# Patient Record
Sex: Male | Born: 1950 | Race: White | Hispanic: No | Marital: Married | Smoking: Former smoker
Health system: Southern US, Community
[De-identification: ages and names within clinical notes are randomized; demographics above are authoritative.]

## PROBLEM LIST (undated history)

## (undated) DIAGNOSIS — E785 Hyperlipidemia, unspecified: Secondary | ICD-10-CM

## (undated) DIAGNOSIS — B019 Varicella without complication: Secondary | ICD-10-CM

## (undated) DIAGNOSIS — N200 Calculus of kidney: Secondary | ICD-10-CM

## (undated) DIAGNOSIS — I10 Essential (primary) hypertension: Secondary | ICD-10-CM

## (undated) DIAGNOSIS — T7840XA Allergy, unspecified, initial encounter: Secondary | ICD-10-CM

## (undated) DIAGNOSIS — C8590 Non-Hodgkin lymphoma, unspecified, unspecified site: Secondary | ICD-10-CM

## (undated) HISTORY — DX: Varicella without complication: B01.9

## (undated) HISTORY — DX: Allergy, unspecified, initial encounter: T78.40XA

## (undated) HISTORY — PX: LYMPH NODE BIOPSY: SHX201

## (undated) HISTORY — DX: Essential (primary) hypertension: I10

## (undated) HISTORY — DX: Hyperlipidemia, unspecified: E78.5

## (undated) HISTORY — DX: Non-Hodgkin lymphoma, unspecified, unspecified site: C85.90

## (undated) HISTORY — DX: Calculus of kidney: N20.0

---

## 1983-09-03 HISTORY — PX: LITHOTRIPSY: SUR834

## 2015-08-09 ENCOUNTER — Encounter: Payer: Self-pay | Admitting: Podiatry

## 2015-08-09 ENCOUNTER — Ambulatory Visit (INDEPENDENT_AMBULATORY_CARE_PROVIDER_SITE_OTHER): Payer: Medicare Other | Admitting: Podiatry

## 2015-08-09 VITALS — BP 142/95 | HR 112 | Resp 16

## 2015-08-09 DIAGNOSIS — L923 Foreign body granuloma of the skin and subcutaneous tissue: Secondary | ICD-10-CM

## 2015-08-09 MED ORDER — DOXYCYCLINE HYCLATE 100 MG PO TABS: 100.0000 mg | ORAL_TABLET | Freq: Two times a day (BID) | ORAL | Status: DC

## 2015-08-09 NOTE — Progress Notes (Signed)
   Subjective:    Patient ID: Cameron Miles, male    DOB: 10/18/1950, 64 y.o.   MRN: [id number]  HPI: He presents today for a chief complaint of a splinter forefoot left times several months. He states he was back in the summer he stepped on his front porch and receive a splinter in the forefoot. He states that he his wife have worked on and on occasions to try to remove it however has become more painful. He states that as of late he is starting to feel bad and his foot is starting to hurt worse. He states that currently it is so painful he can no longer stand on it.    Review of Systems  HENT: Positive for sinus pressure.   Musculoskeletal: Positive for back pain.  All other systems reviewed and are negative.      Objective:   Physical Exam: 64 year old male vital signs stable alert and oriented 3 in no apparent distress. Pulses are strongly palpable neurologic sensorium is intact percent joint C monofilament. Deep tendon reflexes are intact muscle strength is normal bilateral. Orthopedic evaluation demonstrates all joints distal to the ankle for range of motion or crepitation. Cutaneous evaluation demonstrates a dime size area of erythema plantar aspect of the forefoot right beneath the third metatarsal phalangeal joint. This area of erythema is a central area of scar and reactive hyperkeratosis. I debrided this and purulence was expressed as well as a quarter inch long splinter of bone. This appears to be a superficial foreign body. Very superficial cellulitic abscess lesion.        Assessment & Plan:  Assessment retention foreign body right foot.  Plan: I and D with removal of foreign body. I discussed soaking twice daily Epsom salts and water and I also wrote a prescription for doxycycline. I will follow-up with him in 2 weeks.

## 2015-11-16 DIAGNOSIS — J301 Allergic rhinitis due to pollen: Secondary | ICD-10-CM | POA: Diagnosis not present

## 2015-11-16 DIAGNOSIS — Z79899 Other long term (current) drug therapy: Secondary | ICD-10-CM | POA: Diagnosis not present

## 2015-11-16 DIAGNOSIS — I10 Essential (primary) hypertension: Secondary | ICD-10-CM | POA: Diagnosis not present

## 2015-11-16 DIAGNOSIS — Z125 Encounter for screening for malignant neoplasm of prostate: Secondary | ICD-10-CM | POA: Diagnosis not present

## 2015-11-16 DIAGNOSIS — E7800 Pure hypercholesterolemia, unspecified: Secondary | ICD-10-CM | POA: Diagnosis not present

## 2015-12-05 DIAGNOSIS — R739 Hyperglycemia, unspecified: Secondary | ICD-10-CM | POA: Diagnosis not present

## 2015-12-07 DIAGNOSIS — I10 Essential (primary) hypertension: Secondary | ICD-10-CM | POA: Diagnosis not present

## 2015-12-07 DIAGNOSIS — R748 Abnormal levels of other serum enzymes: Secondary | ICD-10-CM | POA: Diagnosis not present

## 2015-12-07 DIAGNOSIS — E8881 Metabolic syndrome: Secondary | ICD-10-CM | POA: Diagnosis not present

## 2015-12-07 DIAGNOSIS — R7301 Impaired fasting glucose: Secondary | ICD-10-CM | POA: Diagnosis not present

## 2016-02-13 ENCOUNTER — Ambulatory Visit: Payer: Self-pay | Admitting: Family Medicine

## 2016-05-20 DIAGNOSIS — E7800 Pure hypercholesterolemia, unspecified: Secondary | ICD-10-CM | POA: Diagnosis not present

## 2016-05-20 DIAGNOSIS — Z0000 Encounter for general adult medical examination without abnormal findings: Secondary | ICD-10-CM | POA: Diagnosis not present

## 2016-05-20 DIAGNOSIS — Z79899 Other long term (current) drug therapy: Secondary | ICD-10-CM | POA: Diagnosis not present

## 2016-05-20 DIAGNOSIS — Z23 Encounter for immunization: Secondary | ICD-10-CM | POA: Diagnosis not present

## 2016-05-20 DIAGNOSIS — R739 Hyperglycemia, unspecified: Secondary | ICD-10-CM | POA: Diagnosis not present

## 2016-05-20 DIAGNOSIS — I10 Essential (primary) hypertension: Secondary | ICD-10-CM | POA: Diagnosis not present

## 2016-05-20 DIAGNOSIS — Z125 Encounter for screening for malignant neoplasm of prostate: Secondary | ICD-10-CM | POA: Diagnosis not present

## 2016-05-30 DIAGNOSIS — Z1211 Encounter for screening for malignant neoplasm of colon: Secondary | ICD-10-CM | POA: Diagnosis not present

## 2016-07-31 DIAGNOSIS — J0100 Acute maxillary sinusitis, unspecified: Secondary | ICD-10-CM | POA: Diagnosis not present

## 2016-11-14 DIAGNOSIS — Z79899 Other long term (current) drug therapy: Secondary | ICD-10-CM | POA: Diagnosis not present

## 2016-11-14 DIAGNOSIS — R739 Hyperglycemia, unspecified: Secondary | ICD-10-CM | POA: Diagnosis not present

## 2016-11-14 DIAGNOSIS — I10 Essential (primary) hypertension: Secondary | ICD-10-CM | POA: Diagnosis not present

## 2016-11-18 DIAGNOSIS — M25561 Pain in right knee: Secondary | ICD-10-CM | POA: Diagnosis not present

## 2016-11-18 DIAGNOSIS — I10 Essential (primary) hypertension: Secondary | ICD-10-CM | POA: Diagnosis not present

## 2016-11-18 DIAGNOSIS — E7800 Pure hypercholesterolemia, unspecified: Secondary | ICD-10-CM | POA: Diagnosis not present

## 2016-11-18 DIAGNOSIS — R739 Hyperglycemia, unspecified: Secondary | ICD-10-CM | POA: Diagnosis not present

## 2016-11-18 DIAGNOSIS — Z125 Encounter for screening for malignant neoplasm of prostate: Secondary | ICD-10-CM | POA: Diagnosis not present

## 2016-11-18 DIAGNOSIS — G8929 Other chronic pain: Secondary | ICD-10-CM | POA: Diagnosis not present

## 2016-11-18 DIAGNOSIS — Z79899 Other long term (current) drug therapy: Secondary | ICD-10-CM | POA: Diagnosis not present

## 2017-01-01 DIAGNOSIS — D492 Neoplasm of unspecified behavior of bone, soft tissue, and skin: Secondary | ICD-10-CM | POA: Diagnosis not present

## 2017-01-01 DIAGNOSIS — D229 Melanocytic nevi, unspecified: Secondary | ICD-10-CM | POA: Diagnosis not present

## 2017-01-30 DIAGNOSIS — D234 Other benign neoplasm of skin of scalp and neck: Secondary | ICD-10-CM | POA: Diagnosis not present

## 2017-02-10 DIAGNOSIS — Z4802 Encounter for removal of sutures: Secondary | ICD-10-CM | POA: Diagnosis not present

## 2017-03-27 ENCOUNTER — Encounter: Payer: Self-pay | Admitting: Family Medicine

## 2017-03-27 ENCOUNTER — Ambulatory Visit (INDEPENDENT_AMBULATORY_CARE_PROVIDER_SITE_OTHER): Payer: Medicare Other | Admitting: Family Medicine

## 2017-03-27 VITALS — BP 152/80 | HR 89 | Temp 98.1°F | Resp 18 | Ht 72.5 in | Wt 177.1 lb

## 2017-03-27 DIAGNOSIS — H539 Unspecified visual disturbance: Secondary | ICD-10-CM | POA: Diagnosis not present

## 2017-03-27 DIAGNOSIS — I10 Essential (primary) hypertension: Secondary | ICD-10-CM | POA: Diagnosis not present

## 2017-03-27 DIAGNOSIS — M25422 Effusion, left elbow: Secondary | ICD-10-CM | POA: Diagnosis not present

## 2017-03-27 DIAGNOSIS — R6882 Decreased libido: Secondary | ICD-10-CM | POA: Diagnosis not present

## 2017-03-27 DIAGNOSIS — N471 Phimosis: Secondary | ICD-10-CM

## 2017-03-27 LAB — TESTOSTERONE: Testosterone: 458.04 ng/dL (ref 300.00–890.00)

## 2017-03-27 NOTE — Addendum Note (Signed)
Addended by: Leone Haven on: 03/27/2017 04:56 PM   Modules accepted: Orders

## 2017-03-27 NOTE — Assessment & Plan Note (Addendum)
Suspect related to trauma of hitting his elbow. There are no signs of infection. Discussed referral to sports medicine to have this drained if needed though he declined this at this time and opted to continue management at home. Could trial anti-inflammatories over-the-counter. Continue ice and heat. If not improving over the next 1-2 weeks he will let us know so we can refer him.

## 2017-03-27 NOTE — Assessment & Plan Note (Signed)
Some intermittent trouble focusing at times. I suggested he see an eye doctor.

## 2017-03-27 NOTE — Assessment & Plan Note (Signed)
I suspect this is related to his phimosis and discomfort he gets with sexual intercourse related to this. We will check a testosterone level. We'll refer to urology.

## 2017-03-27 NOTE — Assessment & Plan Note (Signed)
Elevated today though at goal at home. He'll continue to monitor at home and let us know what it is running in 2-3 weeks. He'll continue clonidine.

## 2017-03-27 NOTE — Progress Notes (Signed)
Tommi Rumps, MD Phone: [phone]  Cameron Miles is a 66 y.o. male who presents today for new patient.  HYPERTENSION  Disease Monitoring  Home BP Monitoring 120s/70-80 Chest pain- no    Dyspnea- no Medications  Compliance-  Taking clonidine. Lightheadedness-  no  Edema- no  Patient notes low libido for the last several years. He does have difficulty getting an erection and maintaining an erection. Does get some nighttime erections are not as frequent as prior. His penis is not correct his previously either. He is able to ejaculate. He does note some phimosis that has been going on for some time now. This will tear and become painful when having sexual intercourse.  3 weeks ago patient reports he hit his right elbow on the refrigerator. He notes the elbow became very swollen and there was bruising just proximal to the elbow just distal to the elbow. He notes the swelling and bruising has gone down quite a bit. He notes the bursa over the olecranon process has remained somewhat swollen though is 2-3 times better than it was prior. Minimal tenderness at times. Has been icing and using a warm bath.  Reports occasional trouble focusing with his vision. Looks at a screen for a long time. Does wear reading glasses. No specific distance vision issues.  Active Ambulatory Problems    Diagnosis Date Noted  . Hypertension 03/27/2017  . Decreased libido 03/27/2017  . Effusion of left olecranon bursa 03/27/2017  . Vision changes 03/27/2017   Resolved Ambulatory Problems    Diagnosis Date Noted  . No Resolved Ambulatory Problems   Past Medical History:  Diagnosis Date  . Allergy   . Chicken pox   . Hyperlipidemia   . Hypertension   . Kidney stones     Family History  Problem Relation Age of Onset  . Stroke Mother   . Prostate cancer Brother   . Hyperlipidemia Brother   . Heart disease Brother   . Stroke Brother   . Kidney disease Brother   . Mental illness Brother   . Melanoma  Brother     Social History   Social History  . Marital status: Married    Spouse name: N/A  . Number of children: N/A  . Years of education: N/A   Occupational History  . Not on file.   Social History Main Topics  . Smoking status: Former Smoker    Quit date: 08/08/1969  . Smokeless tobacco: Never Used  . Alcohol use 0.0 oz/week  . Drug use: No  . Sexual activity: Yes   Other Topics Concern  . Not on file   Social History Narrative  . No narrative on file    ROS  General:  Negative for nexplained weight loss, fever Skin: Negative for new or changing mole, sore that won't heal HEENT: Positive for trouble seeing, ringing in ears, Negative for trouble hearing, mouth sores, hoarseness, change in voice, dysphagia. CV:  Negative for chest pain, dyspnea, edema, palpitations Resp: Negative for cough, dyspnea, hemoptysis GI: Negative for nausea, vomiting, diarrhea, constipation, abdominal pain, melena, hematochezia. GU: Positive for sexual difficulty, Negative for dysuria, incontinence, urinary hesitance, hematuria, vaginal or penile discharge, polyuria, lumps in testicle or breasts MSK: Negative for muscle cramps or aches, joint pain or swelling Neuro: Negative for headaches, weakness, numbness, dizziness, passing out/fainting Psych: Negative for depression, anxiety, memory problems  Objective  Physical Exam Vitals:   03/27/17 0916  BP: (!) 152/80  Pulse: 89  Resp: 18  Temp: 98.1 F (36.7 C)    BP Readings from Last 3 Encounters:  03/27/17 (!) 152/80  08/09/15 (!) 142/95   Wt Readings from Last 3 Encounters:  03/27/17 177 lb 2 oz (80.3 kg)    Physical Exam  Constitutional: No distress.  HENT:  Head: Normocephalic and atraumatic.  Mouth/Throat: Oropharynx is clear and moist. No oropharyngeal exudate.  Eyes: Pupils are equal, round, and reactive to light. Conjunctivae are normal.  Cardiovascular: Normal rate, regular rhythm and normal heart sounds.     Pulmonary/Chest: Effort normal and breath sounds normal.  Abdominal: Soft. Bowel sounds are normal. He exhibits no distension. There is no tenderness. There is no rebound and no guarding.  Genitourinary:  Genitourinary Comments: Uncircumcised, phimosis noted with difficulty retracting foreskin, no discharge, no tenderness, no discomfort, normal scrotum, normal testicles, normal epididymis, normal vas deferens, no inguinal hernias  Musculoskeletal: He exhibits no edema.  Right olecranon bursa enlarged, there is no tenderness warmth or erythema, full range of motion bilateral elbows, 5/5 strength bilateral biceps, triceps, and grip  Neurological: He is alert. Gait normal.  Skin: Skin is warm and dry. He is not diaphoretic.  Psychiatric: Mood and affect normal.     Assessment/Plan:   Hypertension Elevated today though at goal at home. He'll continue to monitor at home and let us know what it is running in 2-3 weeks. He'll continue clonidine.  Decreased libido I suspect this is related to his phimosis and discomfort he gets with sexual intercourse related to this. We will check a testosterone level. We'll refer to urology.  Effusion of left olecranon bursa Suspect related to trauma of hitting his elbow. There are no signs of infection. Discussed referral to sports medicine to have this drained if needed though he declined this at this time and opted to continue management at home. Could trial anti-inflammatories over-the-counter. Continue ice and heat. If not improving over the next 1-2 weeks he will let us know so we can refer him.  Vision changes Some intermittent trouble focusing at times. I suggested he see an eye doctor.   Orders Placed This Encounter  Procedures  . Testosterone  . Ambulatory referral to Urology    Referral Priority:   Routine    Referral Type:   Consultation    Referral Reason:   Specialty Services Required    Requested Specialty:   Urology    Number of Visits  Requested:   1    No orders of the defined types were placed in this encounter.    Tommi Rumps, MD [facility]

## 2017-03-27 NOTE — Patient Instructions (Signed)
Nice to meet you. Please monitor your right elbow. You could try anti-inflammatories such as ibuprofen or Aleve over-the-counter. She can also use heat and ice. We'll refer you to urology. I would advise seeing an eye doctor as well. We'll call you with your testosterone result.

## 2017-04-18 ENCOUNTER — Encounter: Payer: Self-pay | Admitting: Urology

## 2017-04-18 ENCOUNTER — Ambulatory Visit (INDEPENDENT_AMBULATORY_CARE_PROVIDER_SITE_OTHER): Payer: Medicare Other | Admitting: Urology

## 2017-04-18 VITALS — BP 146/81 | HR 96 | Ht 72.05 in | Wt 177.3 lb

## 2017-04-18 DIAGNOSIS — Z8042 Family history of malignant neoplasm of prostate: Secondary | ICD-10-CM | POA: Insufficient documentation

## 2017-04-18 DIAGNOSIS — N400 Enlarged prostate without lower urinary tract symptoms: Secondary | ICD-10-CM

## 2017-04-18 DIAGNOSIS — Z125 Encounter for screening for malignant neoplasm of prostate: Secondary | ICD-10-CM | POA: Diagnosis not present

## 2017-04-18 DIAGNOSIS — N5201 Erectile dysfunction due to arterial insufficiency: Secondary | ICD-10-CM

## 2017-04-18 DIAGNOSIS — N471 Phimosis: Secondary | ICD-10-CM | POA: Diagnosis not present

## 2017-04-18 NOTE — Addendum Note (Signed)
Addended by: Kyra Manges on: 04/18/2017 11:44 AM   Modules accepted: Orders

## 2017-04-18 NOTE — Progress Notes (Signed)
04/18/2017 7:37 AM   Cameron Miles 1951/01/16 [id number]  Referring provider: Leone Haven, MD [address] [place], [place] [address]  CC: new patient ED, phimosis, prostate screening  HPI:  1. Prostate cancer screening - Pt's brother with advanced prostate cancer 04/2017 DRE 60gm smooth / PSA (today / pending)  2. Phimosis - progresive bother form inability to completely retract foreskin. Mostly bothersome during intercourse. Exam c/w mild phimosis but impressive glans adhesions.   3 - Urolithiasis - s/p SWL in 7494W, no colic since.  PMH sig for HTN. No ischemic CV disease / blood thinners . His PCP is Tommi Rumps with [facility].  Today "Cameron Miles " is seen as new patient for above.    PMH: Past Medical History:  Diagnosis Date  . Allergy   . Chicken pox   . Hyperlipidemia   . Hypertension   . Kidney stones     Surgical History: Past Surgical History:  Procedure Laterality Date  . LITHOTRIPSY  1985    Home Medications:  Allergies as of 04/18/2017   No Known Allergies     Medication List       Accurate as of 04/18/17  7:37 AM. Always use your most recent med list.          cloNIDine 0.1 MG tablet Commonly known as:  CATAPRES 2 mg 2 (two) times daily.       Allergies: No Known Allergies  Family History: Family History  Problem Relation Age of Onset  . Stroke Mother   . Prostate cancer Brother   . Hyperlipidemia Brother   . Heart disease Brother   . Stroke Brother   . Kidney disease Brother   . Mental illness Brother   . Melanoma Brother     Social History:  reports that he quit smoking about 47 years ago. He has never used smokeless tobacco. He reports that he drinks alcohol. He reports that he does not use drugs.   Review of Systems  Gastrointestinal (upper)  : Negative for upper GI symptoms  Gastrointestinal (lower) : Negative for lower GI symptoms  Constitutional : Negative for symptoms  Skin: Negative for skin  symptoms  Eyes: Negative for eye symptoms  Ear/Nose/Throat : Negative for Ear/Nose/Throat symptoms  Hematologic/Lymphatic: Negative for Hematologic/Lymphatic symptoms  Cardiovascular : Negative for cardiovascular symptoms  Respiratory : Negative for respiratory symptoms  Endocrine: Negative for endocrine symptoms  Musculoskeletal: Negative for musculoskeletal symptoms  Neurological: Negative for neurological symptoms  Psychologic: Negative for psychiatric symptoms   Physical Exam: There were no vitals taken for this visit.  Constitutional:  Alert and oriented, No acute distress. HEENT: [place] AT, moist mucus membranes.  Trachea midline, no masses. Cardiovascular: No clubbing, cyanosis, or edema. Respiratory: Normal respiratory effort, no increased work of breathing. GI: Abdomen is soft, nontender, nondistended, no abdominal masses GU: No CVA tenderness. Stragght plhalus. Left 2cm upper spermatocele that is non-painful. Mild phimosis with significant posterior glans adhesions.  Skin: No rashes, bruises or suspicious lesions. Lymph: No cervical or inguinal adenopathy. Neurologic: Grossly intact, no focal deficits, moving all 4 extremities. Psychiatric: Normal mood and affect.  Laboratory Data: No results found for: WBC, HGB, HCT, MCV, PLT  No results found for: CREATININE  No results found for: PSA  Lab Results  Component Value Date   TESTOSTERONE 458.04 03/27/2017    No results found for: HGBA1C  Urinalysis No results found for: COLORURINE, APPEARANCEUR, LABSPEC, [place], GLUCOSEU, [place], [place], KETONESUR, PROTEINUR, UROBILINOGEN, NITRITE, LEUKOCYTESUR  Pertinent Imaging: none  Assessment & Plan:    1. Prostate cancer screening - PSA today to complete. Rec continue annualy until age 12 or so given family history.    2. Phimosis - discussed options of observation, topicals (can loosen some), dorsal slit, circumcision. At this point he wants to think  about it, may want circumcision in early new year. He will call us should he want to proceed with that.   3. Nephrolithiasis - no colic on >51 years. Observe.   Alexis Frock, [facility] [address] [place], [place] [address] [phone]

## 2017-04-19 LAB — PSA: PROSTATE SPECIFIC AG, SERUM: 0.6 ng/mL (ref 0.0–4.0)

## 2017-05-09 ENCOUNTER — Telehealth: Payer: Self-pay | Admitting: Family Medicine

## 2017-05-09 ENCOUNTER — Other Ambulatory Visit: Payer: Self-pay | Admitting: Family Medicine

## 2017-05-09 MED ORDER — CLONIDINE HCL 0.1 MG PO TABS: 0.1000 mg | ORAL_TABLET | Freq: Two times a day (BID) | ORAL | 0 refills | Status: DC

## 2017-05-09 NOTE — Telephone Encounter (Signed)
Sent!

## 2017-05-09 NOTE — Telephone Encounter (Signed)
Please advise 

## 2017-05-09 NOTE — Telephone Encounter (Signed)
Pt called and stated that he is out of town and forgot his . He would like to know if we could call in 2 pills until he gets back home. Please advise, thank you!  Pharmacy - [facility] [address]  Call pt @ [phone]

## 2017-05-09 NOTE — Telephone Encounter (Signed)
Forgot what?

## 2017-05-09 NOTE — Telephone Encounter (Signed)
cloNIDine (CATAPRES) 0.1 MG tablet ° °

## 2017-05-19 ENCOUNTER — Ambulatory Visit: Payer: Medicare Other | Admitting: Family Medicine

## 2017-06-30 ENCOUNTER — Ambulatory Visit (INDEPENDENT_AMBULATORY_CARE_PROVIDER_SITE_OTHER): Payer: Medicare Other | Admitting: Family Medicine

## 2017-06-30 ENCOUNTER — Encounter: Payer: Self-pay | Admitting: Family Medicine

## 2017-06-30 VITALS — BP 132/88 | HR 75 | Temp 97.9°F | Wt 181.8 lb

## 2017-06-30 DIAGNOSIS — H938X1 Other specified disorders of right ear: Secondary | ICD-10-CM

## 2017-06-30 DIAGNOSIS — Z125 Encounter for screening for malignant neoplasm of prostate: Secondary | ICD-10-CM | POA: Diagnosis not present

## 2017-06-30 DIAGNOSIS — I10 Essential (primary) hypertension: Secondary | ICD-10-CM

## 2017-06-30 DIAGNOSIS — N471 Phimosis: Secondary | ICD-10-CM

## 2017-06-30 LAB — COMPREHENSIVE METABOLIC PANEL
ALK PHOS: 80 U/L (ref 39–117)
ALT: 34 U/L (ref 0–53)
AST: 47 U/L — ABNORMAL HIGH (ref 0–37)
Albumin: 3.8 g/dL (ref 3.5–5.2)
BUN: 12 mg/dL (ref 6–23)
CALCIUM: 9.2 mg/dL (ref 8.4–10.5)
CHLORIDE: 103 meq/L (ref 96–112)
CO2: 25 mEq/L (ref 19–32)
Creatinine, Ser: 0.68 mg/dL (ref 0.40–1.50)
GFR: 123.17 mL/min (ref >60.00)
Glucose, Bld: 104 mg/dL — ABNORMAL HIGH (ref 70–99)
POTASSIUM: 4.4 meq/L (ref 3.5–5.1)
Sodium: 136 mEq/L (ref 135–145)
TOTAL PROTEIN: 6.6 g/dL (ref 6.0–8.3)
Total Bilirubin: 1.9 mg/dL — ABNORMAL HIGH (ref 0.2–1.2)

## 2017-06-30 NOTE — Progress Notes (Signed)
  Tommi Rumps, MD Phone: [phone]  Cameron Miles is a 66 y.o. male who presents today for follow-up.  Patient notes he had an upper respiratory infection 2-3 weeks ago. Had cough and congestion with postnasal drip. It was a viral illness and that improved though he continues to have some right ear fullness and stopped up sensation. He does have chronic tinnitus which is unchanged. No drainage from his ear.  Hypertension: BP similar to today. Taking clonidine. No chest pain, shortness breath, or edema.  He questions whether or not he should be on aspirin. He does take this intermittently for aches and pains. In the past he possibly had an allergy to this with possible hives that could've been related to oysters or stress. Noted a pulling sensation in his throat around that time as well. 30 years ago he would have hives if he took aspirin though this has not recurred. Over the last 30 years he has intermittently been taking aspirin with no adverse effects.  He saw urology for his phimosis and also had PSA checked given that his brother possibly a prostate cancer. They're considering circumcision in the new year.  PMH: nonsmoker.   ROS see history of present illness  Objective  Physical Exam Vitals:   06/30/17 0928  BP: 132/88  Pulse: 75  Temp: 97.9 F (36.6 C)  SpO2: 95%    BP Readings from Last 3 Encounters:  06/30/17 132/88  04/18/17 (!) 146/81  03/27/17 (!) 152/80   Wt Readings from Last 3 Encounters:  06/30/17 181 lb 12.8 oz (82.5 kg)  04/18/17 177 lb 4.8 oz (80.4 kg)  03/27/17 177 lb 2 oz (80.3 kg)    Physical Exam  Constitutional: No distress.  HENT:  Head: Normocephalic and atraumatic.  Mouth/Throat: Oropharynx is clear and moist. No oropharyngeal exudate.  Normal TMs  Cardiovascular: Normal rate, regular rhythm and normal heart sounds.   Pulmonary/Chest: Effort normal and breath sounds normal.  Musculoskeletal: He exhibits no edema.  Neurological: He is  alert. Gait normal.  Skin: He is not diaphoretic.     Assessment/Plan: Please see individual problem list.  Phimosis Continue to follow with urology.  Prostate cancer screening PSA normal. Brother possibly had prostate cancer. Needs yearly screening.  Ear fullness, right Suspect eustachian tube dysfunction following recent viral illness. Encouraged to continue loratadine. He'll add Flonase. Does note in the past Flonase has made him slightly loopy though he does still have some left. Offered to try Nasonex though he wanted to try Flonase first. If it makes him loopy he'll contact us.  Hypertension Slightly above goal. He'll monitor for the next week at home with a goal of less than 130/80. He'll contact us and let us know what it's been running. Continue clonidine. He will start on 81 mg aspirin daily as well for primary prevention. Seems unlikely that he has an allergy to aspirin given that he has tolerated it over the last 30 years. If he develops any adverse reaction to this he will discontinue it and let us know or be evaluated.   Orders Placed This Encounter  Procedures  . Comp Met (CMET)   Tommi Rumps, MD [place]

## 2017-06-30 NOTE — Assessment & Plan Note (Addendum)
Slightly above goal. He'll monitor for the next week at home with a goal of less than 130/80. He'll contact us and let us know what it's been running. Continue clonidine. He will start on 81 mg aspirin daily as well for primary prevention. Seems unlikely that he has an allergy to aspirin given that he has tolerated it over the last 30 years. If he develops any adverse reaction to this he will discontinue it and let us know or be evaluated.

## 2017-06-30 NOTE — Assessment & Plan Note (Signed)
Continue to follow with urology

## 2017-06-30 NOTE — Patient Instructions (Signed)
Nice to see you. Please start taking a 81 mg aspirin daily. You can start using the Flonase 1 spray each nostril daily to see if that helps with your ears. Please continue the loratadine. Please monitor blood pressure at home over the next week and contact us with the results. Ideally your blood pressure should be less than 130/80. If it is not we may need to add additional medication.

## 2017-06-30 NOTE — Assessment & Plan Note (Signed)
PSA normal. Brother possibly had prostate cancer. Needs yearly screening.

## 2017-06-30 NOTE — Assessment & Plan Note (Addendum)
Suspect eustachian tube dysfunction following recent viral illness. Encouraged to continue loratadine. He'll add Flonase. Does note in the past Flonase has made him slightly loopy though he does still have some left. Offered to try Nasonex though he wanted to try Flonase first. If it makes him loopy he'll contact us.

## 2017-07-03 ENCOUNTER — Other Ambulatory Visit: Payer: Self-pay | Admitting: Family Medicine

## 2017-07-03 DIAGNOSIS — R945 Abnormal results of liver function studies: Principal | ICD-10-CM

## 2017-07-03 DIAGNOSIS — R7989 Other specified abnormal findings of blood chemistry: Secondary | ICD-10-CM

## 2017-07-11 ENCOUNTER — Telehealth: Payer: Self-pay | Admitting: Family Medicine

## 2017-07-11 MED ORDER — CLONIDINE HCL 0.1 MG PO TABS
0.1000 mg | ORAL_TABLET | Freq: Two times a day (BID) | ORAL | 1 refills | Status: DC
Start: 2017-07-11 — End: 2017-07-11

## 2017-07-11 MED ORDER — CLONIDINE HCL 0.1 MG PO TABS: 0.1000 mg | ORAL_TABLET | Freq: Two times a day (BID) | ORAL | 1 refills | Status: DC

## 2017-07-11 MED ORDER — CLONIDINE HCL 0.1 MG PO TABS: 0.1000 mg | ORAL_TABLET | Freq: Two times a day (BID) | ORAL | 0 refills | Status: DC

## 2017-07-11 NOTE — Addendum Note (Signed)
Addended by: Nanci Pina on: 07/11/2017 03:46 PM   Modules accepted: Orders

## 2017-07-11 NOTE — Telephone Encounter (Signed)
Copied from [place] #[id number]. Topic: Inquiry >> Jul 11, 2017  9:07 AM Pricilla Handler wrote: Reason for CRM: Patient called requesting a refill of medication Clonidine (CATAPRES) 0.1 MG tablet. Patient would like a call back today concerning his refill request.

## 2017-07-11 NOTE — Telephone Encounter (Signed)
Okay to refill for 90-day supply.

## 2017-07-11 NOTE — Telephone Encounter (Signed)
This medication was filled for 2 tablets by [facility] it looks ok to do 90 day supply.

## 2017-07-11 NOTE — Telephone Encounter (Signed)
Refill request for Clonidine / LOV 06/30/17 with Dr. Caryl Bis / Pharmacy verified with patient.

## 2017-08-12 ENCOUNTER — Ambulatory Visit: Payer: Medicare Other | Admitting: Gastroenterology

## 2017-10-02 ENCOUNTER — Other Ambulatory Visit: Payer: Self-pay

## 2017-10-02 ENCOUNTER — Other Ambulatory Visit: Payer: Self-pay | Admitting: Family Medicine

## 2017-10-02 ENCOUNTER — Ambulatory Visit (INDEPENDENT_AMBULATORY_CARE_PROVIDER_SITE_OTHER): Payer: Medicare Other | Admitting: Family Medicine

## 2017-10-02 ENCOUNTER — Encounter: Payer: Self-pay | Admitting: Family Medicine

## 2017-10-02 VITALS — BP 150/100 | HR 88 | Temp 97.4°F | Wt 181.0 lb

## 2017-10-02 DIAGNOSIS — J309 Allergic rhinitis, unspecified: Secondary | ICD-10-CM | POA: Diagnosis not present

## 2017-10-02 DIAGNOSIS — I10 Essential (primary) hypertension: Secondary | ICD-10-CM | POA: Diagnosis not present

## 2017-10-02 DIAGNOSIS — N471 Phimosis: Secondary | ICD-10-CM

## 2017-10-02 DIAGNOSIS — Z8042 Family history of malignant neoplasm of prostate: Secondary | ICD-10-CM | POA: Diagnosis not present

## 2017-10-02 DIAGNOSIS — R945 Abnormal results of liver function studies: Secondary | ICD-10-CM

## 2017-10-02 DIAGNOSIS — R7989 Other specified abnormal findings of blood chemistry: Secondary | ICD-10-CM

## 2017-10-02 DIAGNOSIS — R17 Unspecified jaundice: Secondary | ICD-10-CM

## 2017-10-02 LAB — HEPATIC FUNCTION PANEL
ALT: 43 U/L (ref 0–53)
AST: 57 U/L — ABNORMAL HIGH (ref 0–37)
Albumin: 4.1 g/dL (ref 3.5–5.2)
Alkaline Phosphatase: 88 U/L (ref 39–117)
BILIRUBIN DIRECT: 0.8 mg/dL — AB (ref 0.0–0.3)
BILIRUBIN TOTAL: 2.4 mg/dL — AB (ref 0.2–1.2)
Total Protein: 7.6 g/dL (ref 6.0–8.3)

## 2017-10-02 NOTE — Progress Notes (Signed)
Mailed cologuard form per patient request, he states he will mail it back once he signs it

## 2017-10-02 NOTE — Assessment & Plan Note (Signed)
He will try switching to Allegra.

## 2017-10-02 NOTE — Assessment & Plan Note (Signed)
Reports stable.  He will continue to follow with urology.

## 2017-10-02 NOTE — Assessment & Plan Note (Signed)
Well-controlled at home.  He will continue to monitor there.  He will continue clonidine.  If blood pressure elevates at home he will let us know.

## 2017-10-02 NOTE — Assessment & Plan Note (Signed)
Discussed yearly screening.  Prior PSA acceptable.

## 2017-10-02 NOTE — Assessment & Plan Note (Signed)
Recheck today.  If still elevated we will refer to GI again.

## 2017-10-02 NOTE — Progress Notes (Signed)
  Cameron Rumps, MD Phone: [phone]  Cameron Miles is a 67 y.o. male who presents today for follow-up.  Hypertension: Typically runs 120/80 at home.  Taking clonidine.  No chest pain, shortness of breath, or edema.  Notes allergy symptoms with congestion and postnasal drip.  Some sneezing.  Blowing green mucus out of his nose.  2 slight nosebleeds that stopped quickly related to cold weather.  Taking Claritin.  Cannot tolerate nasal steroids.  He saw urology.  He notes his foreskin is about the same.  Notes his PSA was okay.  Does note his brother has history of prostate cancer.  Elevated LFTs: Appear to have been chronically elevated.  He has a couple of alcoholic beverages daily.  He wanted to hold off on GI referral.  Social History   Tobacco Use  Smoking Status Former Smoker  . Last attempt to quit: 08/08/1969  . Years since quitting: 48.1  Smokeless Tobacco Never Used     ROS see history of present illness  Objective  Physical Exam Vitals:   10/02/17 0954  BP: (!) 150/100  Pulse: 88  Temp: (!) 97.4 F (36.3 C)  SpO2: 95%    BP Readings from Last 3 Encounters:  10/02/17 (!) 150/100  06/30/17 132/88  04/18/17 (!) 146/81   Wt Readings from Last 3 Encounters:  10/02/17 181 lb (82.1 kg)  06/30/17 181 lb 12.8 oz (82.5 kg)  04/18/17 177 lb 4.8 oz (80.4 kg)    Physical Exam  Constitutional: No distress.  HENT:  Head: Normocephalic and atraumatic.  Mouth/Throat: Oropharynx is clear and moist. No oropharyngeal exudate.  Cardiovascular: Normal rate, regular rhythm and normal heart sounds.  Pulmonary/Chest: Effort normal and breath sounds normal.  Musculoskeletal: He exhibits no edema.  Neurological: He is alert. Gait normal.  Skin: Skin is warm and dry. He is not diaphoretic.     Assessment/Plan: Please see individual problem list.  Hypertension Well-controlled at home.  He will continue to monitor there.  He will continue clonidine.  If blood pressure  elevates at home he will let us know.  Phimosis Reports stable.  He will continue to follow with urology.  Family history of prostate cancer Discussed yearly screening.  Prior PSA acceptable.  Allergic rhinitis He will try switching to Allegra.  Elevated LFTs Recheck today.  If still elevated we will refer to GI again.   Orders Placed This Encounter  Procedures  . Hepatic function panel    No orders of the defined types were placed in this encounter.    Cameron Rumps, MD [place]

## 2017-10-02 NOTE — Patient Instructions (Signed)
Nice to see you. Please continue to monitor at home.  If it starts to run higher please let us know. Please try Allegra for your sinus issues.  You can discontinue the Claritin.

## 2017-10-29 ENCOUNTER — Ambulatory Visit: Payer: Medicare Other | Admitting: Gastroenterology

## 2017-11-12 ENCOUNTER — Ambulatory Visit (INDEPENDENT_AMBULATORY_CARE_PROVIDER_SITE_OTHER): Payer: Medicare Other | Admitting: Gastroenterology

## 2017-11-12 ENCOUNTER — Other Ambulatory Visit
Admission: RE | Admit: 2017-11-12 | Discharge: 2017-11-12 | Disposition: A | Discharge status: Home / Self Care | Payer: Medicare Other | Source: Ambulatory Visit | Attending: Gastroenterology | Admitting: Gastroenterology

## 2017-11-12 ENCOUNTER — Encounter: Payer: Self-pay | Admitting: Gastroenterology

## 2017-11-12 VITALS — BP 158/100 | HR 98 | Ht 72.0 in | Wt 178.6 lb

## 2017-11-12 DIAGNOSIS — R945 Abnormal results of liver function studies: Secondary | ICD-10-CM

## 2017-11-12 DIAGNOSIS — R7989 Other specified abnormal findings of blood chemistry: Secondary | ICD-10-CM

## 2017-11-12 LAB — CBC WITH DIFFERENTIAL/PLATELET
Basophils Absolute: 0 10*3/uL (ref 0–0.1)
Basophils Relative: 1 %
Eosinophils Absolute: 0.2 10*3/uL (ref 0–0.7)
Eosinophils Relative: 3 %
HEMATOCRIT: 51.7 % (ref 40.0–52.0)
HEMOGLOBIN: 16.8 g/dL (ref 13.0–18.0)
LYMPHS ABS: 1.3 10*3/uL (ref 1.0–3.6)
Lymphocytes Relative: 23 %
MCH: 30 pg (ref 26.0–34.0)
MCHC: 32.6 g/dL (ref 32.0–36.0)
MCV: 92.2 fL (ref 80.0–100.0)
Monocytes Absolute: 0.7 10*3/uL (ref 0.2–1.0)
Monocytes Relative: 13 %
NEUTROS ABS: 3.5 10*3/uL (ref 1.4–6.5)
NEUTROS PCT: 60 %
Platelets: 122 10*3/uL — ABNORMAL LOW (ref 150–440)
RBC: 5.6 MIL/uL (ref 4.40–5.90)
RDW: 13.4 % (ref 11.5–14.5)
WBC: 5.7 10*3/uL (ref 3.8–10.6)

## 2017-11-12 LAB — IRON AND TIBC
Iron: 215 ug/dL — ABNORMAL HIGH (ref 45–182)
Saturation Ratios: 68 % — ABNORMAL HIGH (ref 17.9–39.5)
TIBC: 317 ug/dL (ref 250–450)
UIBC: 102 ug/dL

## 2017-11-12 LAB — FERRITIN: Ferritin: 589 ng/mL — ABNORMAL HIGH (ref 24–336)

## 2017-11-12 LAB — PROTIME-INR
INR: 1.16
Prothrombin Time: 14.7 seconds (ref 11.4–15.2)

## 2017-11-12 NOTE — Patient Instructions (Signed)
1.  Contact Central Scheduling to arrange a date and time for your Ultrasound.  [phone].  2. Complete labs at either [facility] or [facility].  3. Follow-up appointment due in 6 - 8 weeks.

## 2017-11-12 NOTE — Progress Notes (Signed)
Jonathon Bellows MD, MRCP(U.K) [address]  [place]  [place], [place] [address]  Main: [phone]  Fax: [phone]   Gastroenterology Consultation  Referring Provider:     Leone Haven, MD Primary Care Physician:  Leone Haven, MD Primary Gastroenterologist:  Dr. Jonathon Bellows  Reason for Consultation:     Abnormal LFT's         HPI:   Cameron Miles is a 67 y.o. y/o male referred for consultation & management  by Dr. Caryl Bis, Angela Adam, MD.     First noted abnormality in LFT's in 11/2015  .  Alcohol use : Daily , last 20 years, consumes atleast 3 drinks a day , no drink in the morning . Usually a beer.   Drug use : never  Over the counter herbal supplements none, no Internet meds, none recently  New medications : none  Abdominal pain none  Tattoos none  Military service none  Prior blood transfusion none Incarceration none  History of travel 2-3 years back to [place], [place] 20-30 years. Presently retired. Ex case worker  Family history of liver disease none  Recent change in weight : none     Hepatic Function Latest Ref Rng & Units 10/02/2017 06/30/2017  Total Protein 6.0 - 8.3 g/dL 7.6 6.6  Albumin 3.5 - 5.2 g/dL 4.1 3.8  AST 0 - 37 U/L 57(H) 47(H)  ALT 0 - 53 U/L 43 34  Alk Phosphatase 39 - 117 U/L 88 80  Total Bilirubin 0.2 - 1.2 mg/dL 2.4(H) 1.9(H)  Bilirubin, Direct 0.0 - 0.3 mg/dL 0.8(H) -       Past Medical History:  Diagnosis Date  . Allergy   . Chicken pox   . Hyperlipidemia   . Hypertension   . Kidney stones     Past Surgical History:  Procedure Laterality Date  . LITHOTRIPSY  1985    Prior to Admission medications   Medication Sig Start Date End Date Taking? Authorizing Provider  aspirin EC 81 MG tablet Take 81 mg by mouth daily.   Yes [provider]  cloNIDine (CATAPRES) 0.1 MG tablet Take 1 tablet (0.1 mg total) 2 (two) times daily by mouth. 07/11/17  Yes Leone Haven, MD  diphenhydrAMINE (SOMINEX) 25 MG  tablet Take 25 mg by mouth at bedtime as needed for sleep.   Yes [provider]  Loratadine (CLARITIN) 10 MG CAPS Take by mouth.   Yes [provider]  guaiFENesin (MUCINEX) 600 MG 12 hr tablet Take by mouth 2 (two) times daily as needed.     [provider]  ibuprofen (ADVIL,MOTRIN) 200 MG tablet Take 200 mg by mouth every 6 (six) hours as needed.    [provider]    Family History  Problem Relation Age of Onset  . Stroke Mother   . Prostate cancer Brother   . Hyperlipidemia Brother   . Heart disease Brother   . Stroke Brother   . Kidney disease Brother   . Mental illness Brother   . Melanoma Brother   . Bladder Cancer Neg Hx   . Kidney cancer Neg Hx      Social History   Tobacco Use  . Smoking status: Former Smoker    Last attempt to quit: 08/08/1969    Years since quitting: 48.2  . Smokeless tobacco: Never Used  Substance Use Topics  . Alcohol use: Yes    Alcohol/week: 4.2 oz    Types: 7 Standard drinks  or equivalent per week  . Drug use: No    Allergies as of 11/12/2017  . (No Known Allergies)    Review of Systems:    All systems reviewed and negative except where noted in HPI.   Physical Exam:  BP (!) 158/100 (BP Location: Right Arm, Patient Position: Sitting, Cuff Size: Large)   Pulse 98   Ht 6' (1.829 m)   Wt 178 lb 9.6 oz (81 kg)   BMI 24.22 kg/m  No LMP for male patient. Psych:  Alert and cooperative. Normal mood and affect. General:   Alert,  Well-developed, well-nourished, pleasant and cooperative in NAD Head:  Normocephalic and atraumatic. Eyes:  Sclera clear, no icterus.   Conjunctiva pink. Ears:  Normal auditory acuity. Nose:  No deformity, discharge, or lesions. Mouth:  No deformity or lesions,oropharynx pink & moist. Neck:  Supple; no masses or thyromegaly. Lungs:  Respirations even and unlabored.  Clear throughout to auscultation.   No wheezes, crackles, or rhonchi. No acute distress. Heart:  Regular rate  and rhythm; no murmurs, clicks, rubs, or gallops. Abdomen:  Normal bowel sounds.  No bruits.  Soft, non-tender and non-distended without masses, hepatosplenomegaly or hernias noted.  No guarding or rebound tenderness.    Msk:  Symmetrical without gross deformities. Good, equal movement & strength bilaterally. Pulses:  Normal pulses noted. Extremities:  No clubbing or edema.  No cyanosis. Neurologic:  Alert and oriented x3;  grossly normal neurologically. Skin:  Intact without significant lesions or rashes. No jaundice. Lymph Nodes:  No significant cervical adenopathy. Psych:  Alert and cooperative. Normal mood and affect.  Imaging Studies: No results found.  Assessment and Plan:   Cameron Miles is a 67 y.o. y/o male has been referred for elevated liver function tests most likely secondary to alcohol use.   Further labs necessary to look for viral hepatitis, autoimmune liver disease, Primary Biliary cirrhosis, celiac disease, muscle disorders,Primary Sclerosing Cholangitis, Hemachromatosis, Wilson's disease, or A-1 antitrypsin deficiency.   I strongly advised to stop all alcohol consumption.   Follow up in 6 weeks +  Dr Jonathon Bellows MD,MRCP(U.K)

## 2017-11-13 LAB — CELIAC DISEASE PANEL
ENDOMYSIAL ANTIBODY IGA: NEGATIVE
IgA: 438 mg/dL — ABNORMAL HIGH (ref 61–437)

## 2017-11-13 LAB — MISC LABCORP TEST (SEND OUT)
LABCORP TEST CODE: 1982
Labcorp test code: 6395

## 2017-11-13 LAB — HEPATITIS B CORE ANTIBODY, TOTAL: Hep B Core Total Ab: NEGATIVE

## 2017-11-13 LAB — HEPATITIS A ANTIBODY, TOTAL: Hep A Total Ab: POSITIVE — AB

## 2017-11-13 LAB — CERULOPLASMIN: Ceruloplasmin: 17.2 mg/dL (ref 16.0–31.0)

## 2017-11-13 LAB — HEPATITIS C ANTIBODY: HCV Ab: <0.1 s/co ratio (ref 0.0–0.9)

## 2017-11-13 LAB — MITOCHONDRIAL ANTIBODIES: Mitochondrial M2 Ab, IgG: 25 Units — ABNORMAL HIGH (ref 0.0–20.0)

## 2017-11-13 LAB — ANTI-SMOOTH MUSCLE ANTIBODY, IGG: F-ACTIN AB IGG: 22 U — AB (ref 0–19)

## 2017-11-14 ENCOUNTER — Telehealth: Payer: Self-pay

## 2017-11-14 ENCOUNTER — Encounter: Payer: Self-pay | Admitting: Gastroenterology

## 2017-11-14 DIAGNOSIS — R899 Unspecified abnormal finding in specimens from other organs, systems and tissues: Secondary | ICD-10-CM

## 2017-11-14 NOTE — Telephone Encounter (Signed)
Advised patient of results per Dr. Vicente Males.   -  Ferritin elevated- needs blood test to check for hemochromatosis gene, needs hep b vaccine.  Patient would like to know more about increased ferritin and hemochromatosis before getting the lab test.  Sent the message to Dr. Vicente Males for a direct callback via inbox and cell message.

## 2017-11-15 LAB — IMMUNOGLOBULINS A/E/G/M, SERUM
IGG (IMMUNOGLOBIN G), SERUM: 1530 mg/dL (ref 700–1600)
IgA: 428 mg/dL (ref 61–437)
IgE (Immunoglobulin E), Serum: 61 IU/mL (ref 6–495)
IgM (Immunoglobulin M), Srm: 106 mg/dL (ref 20–172)

## 2017-11-17 LAB — ALPHA-1 ANTITRYPSIN PHENOTYPE: A-1 Antitrypsin, Ser: 137 mg/dL (ref 90–200)

## 2017-11-18 ENCOUNTER — Telehealth: Payer: Self-pay

## 2017-11-18 ENCOUNTER — Other Ambulatory Visit: Payer: Self-pay

## 2017-11-18 DIAGNOSIS — R899 Unspecified abnormal finding in specimens from other organs, systems and tissues: Secondary | ICD-10-CM

## 2017-11-18 NOTE — Telephone Encounter (Signed)
Advised patient that lab order for hemochromatosis gene was ordered and available at the [facility].   Patient also needs Hep B Vaccine per Dr. Vicente Males. Advised patient he could have this done at PCP office. Precautionary vaccine only.   Patient states he will have the lab completed today or tomorrow.

## 2017-11-26 ENCOUNTER — Telehealth: Payer: Self-pay

## 2017-11-26 ENCOUNTER — Other Ambulatory Visit
Admission: RE | Admit: 2017-11-26 | Discharge: 2017-11-26 | Disposition: A | Discharge status: Home / Self Care | Payer: Medicare Other | Source: Ambulatory Visit | Attending: Gastroenterology | Admitting: Gastroenterology

## 2017-11-26 ENCOUNTER — Ambulatory Visit
Admission: RE | Admit: 2017-11-26 | Discharge: 2017-11-26 | Disposition: A | Discharge status: Home / Self Care | Payer: Medicare Other | Source: Ambulatory Visit | Attending: Gastroenterology | Admitting: Gastroenterology

## 2017-11-26 DIAGNOSIS — R945 Abnormal results of liver function studies: Secondary | ICD-10-CM | POA: Insufficient documentation

## 2017-11-26 DIAGNOSIS — R7989 Other specified abnormal findings of blood chemistry: Secondary | ICD-10-CM

## 2017-11-26 NOTE — Telephone Encounter (Signed)
Pt has been notified of results. Pt wanted Dr. Vicente Males to know he had his [place] and lab done today. I have advised him to go to the lab on 12/19/17 to have those repeat labs Dr. Vicente Males is requesting prior to appt with him on 12/24/17. I have not entered those labs. You may want to go ahead and do that. Thanks!

## 2017-11-26 NOTE — Telephone Encounter (Signed)
-----   Message from Leontine Locket, [place] sent at 11/25/2017 10:01 AM EDT ----- Regarding:  Result Call Thanks Khadeejah Castner!!   ----- Message ----- From: Jonathon Bellows, MD Sent: 11/23/2017   5:50 PM To: Leontine Locket, CMA, Leone Haven, MD  Panya please inform that her AMA and F actin antibody are positive and can be seen so in autoimmune hepatitis . Before we pursue evaluation of the same it is absolutely essential she stops all alcohol. Once she stops all alcohol for 6 weeks , repeat F actin and AMA.    Please order repeat CMP before next office visit and will discuss further  Dr Jonathon Bellows MD,MRCP [facility]) Gastroenterology/Hepatology Pager: [phone]  C/c Caryl Bis, Angela Adam, MD

## 2017-11-27 ENCOUNTER — Other Ambulatory Visit: Payer: Self-pay

## 2017-11-27 DIAGNOSIS — R899 Unspecified abnormal finding in specimens from other organs, systems and tissues: Secondary | ICD-10-CM

## 2017-11-27 NOTE — Progress Notes (Signed)
Repeat labs have been order for patient per Dr. Vicente Males.   AMA CMP F-Actin  Resulting Lab: [facility]

## 2017-11-30 ENCOUNTER — Encounter: Payer: Self-pay | Admitting: Gastroenterology

## 2017-12-02 ENCOUNTER — Telehealth: Payer: Self-pay | Admitting: Gastroenterology

## 2017-12-02 LAB — HEMOCHROMATOSIS DNA-PCR(C282Y,H63D)

## 2017-12-02 NOTE — Telephone Encounter (Signed)
pt left vm he saw a letter on my chart  For the u/s report and wants clarification on the findings of the Liver please call pt ok to leave voicemail

## 2017-12-19 ENCOUNTER — Other Ambulatory Visit
Admission: RE | Admit: 2017-12-19 | Discharge: 2017-12-19 | Disposition: A | Discharge status: Home / Self Care | Payer: Medicare Other | Source: Ambulatory Visit | Attending: Gastroenterology | Admitting: Gastroenterology

## 2017-12-19 DIAGNOSIS — R899 Unspecified abnormal finding in specimens from other organs, systems and tissues: Secondary | ICD-10-CM | POA: Diagnosis not present

## 2017-12-19 LAB — COMPREHENSIVE METABOLIC PANEL
ALT: 30 U/L (ref 17–63)
ANION GAP: 7 (ref 5–15)
AST: 40 U/L (ref 15–41)
Albumin: 4 g/dL (ref 3.5–5.0)
Alkaline Phosphatase: 85 U/L (ref 38–126)
BUN: 10 mg/dL (ref 6–20)
CHLORIDE: 107 mmol/L (ref 101–111)
CO2: 22 mmol/L (ref 22–32)
CREATININE: 0.84 mg/dL (ref 0.61–1.24)
Calcium: 8.9 mg/dL (ref 8.9–10.3)
Glucose, Bld: 99 mg/dL (ref 65–99)
POTASSIUM: 3.8 mmol/L (ref 3.5–5.1)
Sodium: 136 mmol/L (ref 135–145)
Total Bilirubin: 1.7 mg/dL — ABNORMAL HIGH (ref 0.3–1.2)
Total Protein: 7.4 g/dL (ref 6.5–8.1)

## 2017-12-20 LAB — ANTI-SMOOTH MUSCLE ANTIBODY, IGG: F-Actin IgG: 17 Units (ref 0–19)

## 2017-12-21 LAB — ANTI-MICROSOMAL ANTIBODY LIVER / KIDNEY: LKM1 Ab: 1.2 Units (ref 0.0–20.0)

## 2017-12-24 ENCOUNTER — Encounter: Payer: Self-pay | Admitting: Gastroenterology

## 2017-12-24 ENCOUNTER — Ambulatory Visit (INDEPENDENT_AMBULATORY_CARE_PROVIDER_SITE_OTHER): Payer: Medicare Other | Admitting: Gastroenterology

## 2017-12-24 VITALS — BP 155/76 | HR 98 | Ht 72.0 in | Wt 174.4 lb

## 2017-12-24 DIAGNOSIS — R945 Abnormal results of liver function studies: Secondary | ICD-10-CM

## 2017-12-24 DIAGNOSIS — K700 Alcoholic fatty liver: Secondary | ICD-10-CM

## 2017-12-24 DIAGNOSIS — R7989 Other specified abnormal findings of blood chemistry: Secondary | ICD-10-CM

## 2017-12-24 NOTE — Addendum Note (Signed)
Addended by: Peggye Ley on: 12/24/2017 10:04 AM   Modules accepted: Orders

## 2017-12-24 NOTE — Progress Notes (Signed)
Jonathon Bellows MD, MRCP(U.K) [address]  [place]  [place], [place] [address]  Main: [phone]  Fax: [phone]   Primary Care Physician: Leone Haven, MD  Primary Gastroenterologist:  Cameron Miles   Chief Complaint  Patient presents with  . Follow-up    Labs    HPI: Cameron Miles is a 67 y.o. male  Summary of history :  He was initially seen and referred on 11/12/17 for abnormal LFT's since 11/2015 . H/o alcohol consumption 3 drinks a day for the past 20 years. No other herbal meds or supplements.    Interval history   11/12/2017-  12/24/2017  Labs 11/12/17- Hep A ab positive, Hep B c ab ,HCV ab - negative . Ceruloplasmin,A1AT- normal. Iron and %saturation elevated at 68, Ferritin 589 . Serum imunoglubulins normal. AMA,F actin  positive . Low platelet count. INR 1.16 . Celiac serology,LKM ab - normal.   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   At his last visit ordered multiple labs :   11/26/17 : RUQ USG- fatty liver.   Stopped consuming all alcohol. No new issues. Explained that his labs are improved likely due to cessation of all alcohol.   Current Outpatient Medications  Medication Sig Dispense Refill  . aspirin EC 81 MG tablet Take 81 mg by mouth daily.    . cloNIDine (CATAPRES) 0.1 MG tablet Take 1 tablet (0.1 mg total) 2 (two) times daily by mouth. 180 tablet 1  . diphenhydrAMINE (SOMINEX) 25 MG tablet Take 25 mg by mouth at bedtime as needed for sleep.    . Loratadine (CLARITIN) 10 MG CAPS Take by mouth.    Marland Kitchen guaiFENesin (MUCINEX) 600 MG 12 hr tablet Take by mouth 2 (two) times daily as needed.     Marland Kitchen ibuprofen (ADVIL,MOTRIN) 200 MG tablet Take 200 mg by mouth every 6 (six) hours as needed.     No current facility-administered medications for this visit.     Allergies as of 12/24/2017  . (No Known Allergies)    ROS:  General: Negative for anorexia, weight loss,  fever, chills, fatigue, weakness. ENT: Negative for hoarseness, difficulty swallowing , nasal congestion. CV: Negative for chest pain, angina, palpitations, dyspnea on exertion, peripheral edema.  Respiratory: Negative for dyspnea at rest, dyspnea on exertion, cough, sputum, wheezing.  GI: See history of present illness. GU:  Negative for dysuria, hematuria, urinary incontinence, urinary frequency, nocturnal urination.  Endo: Negative for unusual weight change.    Physical Examination:   BP (!) 155/76   Pulse 98   Ht 6' (1.829 m)   Wt 174 lb 6.4 oz (79.1 kg)   BMI 23.65 kg/m   General: Well-nourished, well-developed in no acute distress.  Eyes: No icterus. Conjunctivae pink. Mouth: Oropharyngeal mucosa moist and pink , no lesions erythema or exudate. Lungs: Clear to auscultation bilaterally. Non-labored. Heart: Regular rate and rhythm, no murmurs rubs or gallops.  Abdomen: Bowel sounds are normal, nontender, nondistended, no hepatosplenomegaly or masses, no abdominal bruits or hernia , no rebound or guarding.   Extremities: No lower extremity edema. No clubbing or deformities. Neuro: Alert and oriented x 3.  Grossly intact. Skin: Warm and dry, no jaundice.   Psych: Alert and cooperative, normal mood and affect.   Imaging Studies: US Abdomen Limited Ruq  Result Date: 11/26/2017 CLINICAL DATA:  Abnormal liver function studies EXAM: ULTRASOUND ABDOMEN LIMITED RIGHT  UPPER QUADRANT COMPARISON:  None in PACs FINDINGS: Gallbladder: No gallstones or wall thickening visualized. No sonographic Murphy sign noted by sonographer. Common bile duct: Diameter: 3 mm Liver: The hepatic echotexture is increased diffusely. There is no focal mass nor ductal dilation. The surface contour of the liver is smooth. Portal vein is patent on color Doppler imaging with normal direction of blood flow towards the liver. IMPRESSION: Increased hepatic echotexture most compatible with fatty infiltrative change. No  hepatic masses are identified. No gallstones or sonographic evidence of acute cholecystitis. Electronically Signed   By: David  Martinique M.D.   On: 11/26/2017 10:37    Assessment and Plan:   Cameron Miles is a 67 y.o. y/o male here to follow up for abnormal LFT's. H/o long standing chronic alcohol use. Initially F actin antibody was positive but turned negative when rechecked. Recent LFT's show transaminases are normalized. AMA positive. 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 I suspect his iron studies were elevated due to effects of alcohol and inflammation of the liver. I am hopeful that it will return back to normal now that he has stopped all alcohol . I will recheck in 3-4 months time along with the AMA antibody which was positive. He also has alcoholic fatty liver disease.   Dr Jonathon Bellows  MD,MRCP [facility]) Follow up in 4 months after repeat labs which are ordered for future date.

## 2018-01-05 ENCOUNTER — Other Ambulatory Visit: Payer: Self-pay | Admitting: Family Medicine

## 2018-01-05 NOTE — Telephone Encounter (Signed)
Last OV 10/02/17 last filled 07/11/17 180 1rf

## 2018-03-27 ENCOUNTER — Telehealth: Payer: Self-pay | Admitting: Gastroenterology

## 2018-03-27 NOTE — Telephone Encounter (Signed)
Jeral Fruit can you look into this   [place]

## 2018-03-27 NOTE — Telephone Encounter (Signed)
Pt left as vm he states he is being send to collections due to appointments he had with Dr. Vicente Males and he would like for Dr.Anna to figure out why [facility] is sending him to collections. I called pt back to offer Billing number he states that number does not work and he has been getting the run around. He states he will not come see Dr. Vicente Males until the issue is resolved. I called  Billing for pt and spoke with Shirlee Limerick who states the bill is from [facility] not Dr. Vicente Males and that pt needs to call [phone]. I called pt  Back with information but pt states if Dr. Vicente Males wants pt to continue to see him that Dr. Vicente Males will resolve the issue for him.

## 2018-04-01 ENCOUNTER — Ambulatory Visit (INDEPENDENT_AMBULATORY_CARE_PROVIDER_SITE_OTHER): Payer: Medicare Other | Admitting: Family Medicine

## 2018-04-01 ENCOUNTER — Telehealth: Payer: Self-pay | Admitting: Family Medicine

## 2018-04-01 ENCOUNTER — Ambulatory Visit (INDEPENDENT_AMBULATORY_CARE_PROVIDER_SITE_OTHER): Payer: Medicare Other

## 2018-04-01 VITALS — BP 150/80 | HR 77 | Temp 97.7°F | Resp 18 | Ht 72.0 in | Wt 173.2 lb

## 2018-04-01 VITALS — BP 150/80 | HR 77 | Temp 97.7°F | Ht 72.0 in | Wt 173.2 lb

## 2018-04-01 DIAGNOSIS — Z0000 Encounter for general adult medical examination without abnormal findings: Secondary | ICD-10-CM

## 2018-04-01 DIAGNOSIS — Z1211 Encounter for screening for malignant neoplasm of colon: Secondary | ICD-10-CM

## 2018-04-01 DIAGNOSIS — Z1212 Encounter for screening for malignant neoplasm of rectum: Secondary | ICD-10-CM

## 2018-04-01 DIAGNOSIS — R590 Localized enlarged lymph nodes: Secondary | ICD-10-CM | POA: Insufficient documentation

## 2018-04-01 DIAGNOSIS — G479 Sleep disorder, unspecified: Secondary | ICD-10-CM | POA: Diagnosis not present

## 2018-04-01 DIAGNOSIS — J309 Allergic rhinitis, unspecified: Secondary | ICD-10-CM

## 2018-04-01 DIAGNOSIS — Z23 Encounter for immunization: Secondary | ICD-10-CM | POA: Diagnosis not present

## 2018-04-01 DIAGNOSIS — R7989 Other specified abnormal findings of blood chemistry: Secondary | ICD-10-CM

## 2018-04-01 DIAGNOSIS — I10 Essential (primary) hypertension: Secondary | ICD-10-CM

## 2018-04-01 DIAGNOSIS — R945 Abnormal results of liver function studies: Secondary | ICD-10-CM

## 2018-04-01 NOTE — Telephone Encounter (Signed)
°  Pt returning Trisha's call.  Pt states ok to leave msg or talk with his wife if he is unavailable.  Pt: [phone]

## 2018-04-01 NOTE — Assessment & Plan Note (Signed)
Clonidine may be contributing.  We will consider changing this once we have a better idea of what his blood pressure is running.

## 2018-04-01 NOTE — Telephone Encounter (Signed)
Please let the patient know that I heard back from his GI physician.  It sounds as though colonoscopy would be the better option.  If he is willing we can get him set up with GI for that.  Thanks.

## 2018-04-01 NOTE — Telephone Encounter (Signed)
Left message to call back  

## 2018-04-01 NOTE — Telephone Encounter (Signed)
Returning call.

## 2018-04-01 NOTE — Assessment & Plan Note (Signed)
He will follow-up with GI once his billing issues resolved.

## 2018-04-01 NOTE — Patient Instructions (Addendum)
  Cameron Miles , Thank you for taking time to come for your Medicare Wellness Visit. I appreciate your ongoing commitment to your health goals. Please review the following plan we discussed and let me know if I can assist you in the future.   Follow up as needed.    Bring a copy of your [place] and/or Living Will to be scanned into chart.  Have a great day!  These are the goals we discussed: Goals    . Maintain healthy lifestyle     Exercise Stay hydrated Healthy diet       This is a list of the screening recommended for you and due dates:  Health Maintenance  Topic Date Due  . Tetanus Vaccine  04/09/1968  . Colon Cancer Screening  04/10/1999  . Pneumonia vaccines (1 of 2 - PCV13) 04/09/2014  . Flu Shot  04/02/2018  .  Hepatitis C: One time screening is recommended by Center for Disease Control  (CDC) for  adults born from [birth year] through 1967.   Completed

## 2018-04-01 NOTE — Assessment & Plan Note (Signed)
Scattered enlarged cervical lymph nodes noted on exam.  Discussed further evaluation with imaging.  Patient preferred ultrasound and thus we will start with this.  Advised if he does not hear about this being scheduled in the next week he should contact us.

## 2018-04-01 NOTE — Patient Instructions (Signed)
Nice to see you. Please check your blood pressure daily for the next 2 weeks and then we will have you return for BP check in the office.

## 2018-04-01 NOTE — Progress Notes (Signed)
Tommi Rumps, MD Phone: [phone]  KHIYAN CRACE is a 67 y.o. male who presents today for f/u.  CC: htn, allergic rhinitis, elevated LFTs  HYPERTENSION  Disease Monitoring  Home BP Monitoring 139/79 though not checking frequently  Chest pain- no    Dyspnea- no Medications  Compliance-  Taking clonidine  Allergic rhinitis: Rarely takes his Claritin.  Really only gets symptoms when he mows the grass.  He will have some congestion and postnasal drip.  No sneezing.  No rhinorrhea.  Elevated LFTs: He did see GI.  They trended down to normal after stopping alcohol use.  He was due for recheck though has had some financial issues with billing for a lab test and canceled his appointment with them until this is resolved.  He notes no right upper quadrant pain.  The patient does note for some time now he has been waking up around 3 or 3:30 in the morning.  He notes the clonidine makes him sleepy at night and then he will wake up in the morning and take it again and go back to bed.  He does note some odd dreams with the clonidine.  He notes no depression or anxiety.  Patient reports he lost about 5 to 8 pounds when he quit drinking alcohol.  Since then he is noticed some enlarged lymph nodes in his neck.  He has had no respiratory illnesses recently.  No night sweats.  Social History   Tobacco Use  Smoking Status Former Smoker  . Last attempt to quit: 08/08/1969  . Years since quitting: 48.6  Smokeless Tobacco Never Used     ROS see history of present illness  Objective  Physical Exam Vitals:   04/01/18 0823  BP: (!) 150/80  Pulse: 77  Temp: 97.7 F (36.5 C)  SpO2: 96%    BP Readings from Last 3 Encounters:  04/01/18 (!) 150/80  04/01/18 (!) 150/80  12/24/17 (!) 155/76   Wt Readings from Last 3 Encounters:  04/01/18 173 lb 3.2 oz (78.6 kg)  04/01/18 173 lb 3.2 oz (78.6 kg)  12/24/17 174 lb 6.4 oz (79.1 kg)    Physical Exam  Constitutional: No distress.  Neck:    Scattered mildly enlarged cervical lymph nodes with no tenderness, no supraclavicular lymphadenopathy  Cardiovascular: Normal rate, regular rhythm and normal heart sounds.  Pulmonary/Chest: Effort normal and breath sounds normal.  Abdominal: Soft. Bowel sounds are normal. He exhibits no distension. There is no tenderness.  Musculoskeletal: He exhibits no edema.  Neurological: He is alert.  Skin: Skin is warm and dry. He is not diaphoretic.     Assessment/Plan: Please see individual problem list.  Hypertension Previously well controlled at home. He will check daily for 2 weeks then return for BP check with nursing.  Could consider changing his regimen from clonidine to an alternative medication.  Allergic rhinitis Rare issues.  He will continue to monitor.  Elevated LFTs He will follow-up with GI once his billing issues resolved.  Sleeping difficulty Clonidine may be contributing.  We will consider changing this once we have a better idea of what his blood pressure is running.  Cervical lymphadenopathy Scattered enlarged cervical lymph nodes noted on exam.  Discussed further evaluation with imaging.  Patient preferred ultrasound and thus we will start with this.  Advised if he does not hear about this being scheduled in the next week he should contact us.   Health Maintenance: Patient prefers to do Cologuard.  He does report his  brother had a history of prostate cancer and subsequently developed a cancer in his colon that was the result of possible from his prostate cancer.  I will send a message to his spread to his colon GI physician to determine if Cologuard is an adequate screening tool.  Orders Placed This Encounter  Procedures  . US Soft Tissue Head/Neck    Standing Status:   Future    Standing Expiration Date:   06/02/2019    Order Specific Question:   Reason for Exam (SYMPTOM  OR DIAGNOSIS REQUIRED)    Answer:   scattered enlarged cervical lymph nodes, persistent    Order  Specific Question:   Preferred imaging location?    Answer:   [facility]  . Cologuard    No orders of the defined types were placed in this encounter.    Tommi Rumps, MD [place]

## 2018-04-01 NOTE — Assessment & Plan Note (Signed)
Rare issues.  He will continue to monitor.

## 2018-04-01 NOTE — Progress Notes (Signed)
Subjective:   Cameron Miles is a 67 y.o. male who presents for an Initial Medicare Annual Wellness Visit.  Review of Systems  No ROS.  Medicare Wellness Visit. Additional risk factors are reflected in the social history. Cardiac Risk Factors include: advanced age (>74men, >46 women);male gender;hypertension    Objective:    Today's Vitals   04/01/18 0851  BP: (!) 150/80  Pulse: 77  Resp: 18  Temp: 97.7 F (36.5 C)  TempSrc: Oral  SpO2: 96%  Weight: 173 lb 3.2 oz (78.6 kg)  Height: 6' (1.829 m)   Body mass index is 23.49 kg/m.  Advanced Directives 04/01/2018  Does Patient Have a Medical Advance Directive? Yes  Type of Paramedic of [place];Living will  Does patient want to make changes to medical advance directive? No - Patient declined  Copy of [place] in Chart? No - copy requested    Current Medications (verified) Outpatient Encounter Medications as of 04/01/2018  Medication Sig  . aspirin EC 81 MG tablet Take 81 mg by mouth daily.  . cloNIDine (CATAPRES) 0.1 MG tablet TAKE 1 TABLET BY MOUTH TWICE A DAY  . diphenhydrAMINE (SOMINEX) 25 MG tablet Take 25 mg by mouth at bedtime as needed for sleep.  Marland Kitchen guaiFENesin (MUCINEX) 600 MG 12 hr tablet Take by mouth as needed.   . Loratadine (CLARITIN) 10 MG CAPS Take by mouth as needed.    No facility-administered encounter medications on file as of 04/01/2018.     Allergies (verified) Patient has no known allergies.   History: Past Medical History:  Diagnosis Date  . Allergy   . Chicken pox   . Hyperlipidemia   . Hypertension   . Kidney stones    Past Surgical History:  Procedure Laterality Date  . LITHOTRIPSY  1985   Family History  Problem Relation Age of Onset  . Stroke Mother   . Prostate cancer Brother   . Hyperlipidemia Brother   . Heart disease Brother   . Stroke Brother   . Kidney disease Brother   . Mental illness Brother   . Melanoma Brother   .  Bladder Cancer Neg Hx   . Kidney cancer Neg Hx    Social History   Socioeconomic History  . Marital status: Married    Spouse name: Not on file  . Number of children: Not on file  . Years of education: Not on file  . Highest education level: Not on file  Occupational History  . Not on file  Social Needs  . Financial resource strain: Not hard at all  . Food insecurity:    Worry: Never true    Inability: Never true  . Transportation needs:    Medical: No    Non-medical: No  Tobacco Use  . Smoking status: Former Smoker    Last attempt to quit: 08/08/1969    Years since quitting: 48.6  . Smokeless tobacco: Never Used  Substance and Sexual Activity  . Alcohol use: Yes    Alcohol/week: 4.2 oz    Types: 7 Standard drinks or equivalent per week  . Drug use: No  . Sexual activity: Yes  Lifestyle  . Physical activity:    Days per week: 3 days    Minutes per session: 30 min  . Stress: Not on file  Relationships  . Social connections:    Talks on phone: Not on file    Gets together: Not on file    Attends  religious service: Not on file    Active member of club or organization: Not on file    Attends meetings of clubs or organizations: Not on file    Relationship status: Not on file  Other Topics Concern  . Not on file  Social History Narrative  . Not on file   Tobacco Counseling Counseling given: Not Answered   Clinical Intake:  Pre-visit preparation completed: Yes        Nutritional Status: BMI of 19-24  Normal Diabetes: No  How often do you need to have someone help you when you read instructions, pamphlets, or other written materials from your doctor or pharmacy?: 1 - Never  Interpreter Needed?: No     Activities of Daily Living In your present state of health, do you have any difficulty performing the following activities: 04/01/2018  Hearing? N  Vision? N  Difficulty concentrating or making decisions? N  Walking or climbing stairs? N  Dressing or  bathing? N  Doing errands, shopping? N  Preparing Food and eating ? N  Using the Toilet? N  In the past six months, have you accidently leaked urine? N  Do you have problems with loss of bowel control? N  Managing your Medications? N  Managing your Finances? N  Housekeeping or managing your Housekeeping? N  Some recent data might be hidden     Immunizations and Health Maintenance Immunization History  Administered Date(s) Administered  . Influenza Inj Mdck Quad Pf 05/20/2016  . Pneumococcal Conjugate-13 04/01/2018   Health Maintenance Due  Topic Date Due  . TETANUS/TDAP  04/09/1968  . COLONOSCOPY  04/10/1999  . PNA vac Low Risk Adult (1 of 2 - PCV13) 04/09/2014    Patient Care Team: Leone Haven, MD as PCP - General (Family Medicine)  Indicate any recent Medical Services you may have received from other than [facility] providers in the past year (date may be approximate).    Assessment:   This is a routine wellness examination for Cameron Miles.  The goal of the wellness visit is to assist the patient how to close the gaps in care and create a preventative care plan for the patient.   The roster of all physicians providing medical care to patient is listed in the Snapshot section of the chart.  Osteoporosis risk reviewed.    Safety issues reviewed; Smoke and carbon monoxide detectors in the home. No firearms in the home. Wears seatbelts when driving or riding with others. No violence in the home.  They do not have excessive sun exposure.  Discussed the need for sun protection: hats, long sleeves and the use of sunscreen if there is significant sun exposure.  Patient is alert, normal appearance, oriented to person/place/and time.  Correctly identified the president of the [place] and recalls of 3/3 words. Performs simple calculations and can read correct time from watch face. Displays appropriate judgement.  No new identified risk were noted.  No failures at ADL's or IADL's.     BMI- discussed the importance of a healthy diet, water intake and the benefits of aerobic exercise. Educational material provided.   24 hour diet recall: Regular diet.  Monitors sodium intake.   Dental- every 6 months.  Prevnar 13 administered L deltoid, tolerated well. Educational material provided.  Patient Concerns: None at this time. Follow up with PCP as needed.  Hearing/Vision screen  Visual Acuity Screening   Right eye Left eye Both eyes  Without correction: 20/32 20/25 20/25   With correction:  Comments: Wears corrective lenses when reading   Hearing Screening Comments: Patient is able to hear conversational tones without difficulty.  No issues reported.   Dietary issues and exercise activities discussed: Current Exercise Habits: Home exercise routine, Type of exercise: walking(cuts and stacks wood at his home.), Time (Minutes): 30, Frequency (Times/Week): 3, Weekly Exercise (Minutes/Week): 90, Intensity: Mild  Goals    . Maintain healthy lifestyle     Exercise Stay hydrated Healthy diet      Depression Screen PHQ 2/9 Scores 04/01/2018 03/27/2017  PHQ - 2 Score 0 0    Fall Risk Fall Risk  04/01/2018 03/27/2017  Falls in the past year? No No   Cognitive Function: MMSE - Mini Mental State Exam 04/01/2018  Orientation to time 5  Orientation to Place 5  Registration 3  Attention/ Calculation 5  Recall 3  Language- name 2 objects 2  Language- repeat 1  Language- follow 3 step command 3  Language- read & follow direction 1  Write a sentence 1  Copy design 1  Total score 30        Screening Tests Health Maintenance  Topic Date Due  . TETANUS/TDAP  04/09/1968  . COLONOSCOPY  04/10/1999  . PNA vac Low Risk Adult (1 of 2 - PCV13) 04/09/2014  . INFLUENZA VACCINE  04/02/2018  . Hepatitis C Screening  Completed      Plan:    End of life planning; Advance aging; Advanced directives discussed. Copy of current HCPOA/Living Will requested.    I have  personally reviewed and noted the following in the patient's chart:   . Medical and social history . Use of alcohol, tobacco or illicit drugs  . Current medications and supplements . Functional ability and status . Nutritional status . Physical activity . Advanced directives . List of other physicians . Hospitalizations, surgeries, and ER visits in previous 12 months . Vitals . Screenings to include cognitive, depression, and falls . Referrals and appointments  In addition, I have reviewed and discussed with patient certain preventive protocols, quality metrics, and best practice recommendations. A written personalized care plan for preventive services as well as general preventive health recommendations were provided to patient.     Varney Biles, LPN   8/83/2549

## 2018-04-01 NOTE — Assessment & Plan Note (Signed)
Previously well controlled at home. He will check daily for 2 weeks then return for BP check with nursing.  Could consider changing his regimen from clonidine to an alternative medication.

## 2018-04-01 NOTE — Telephone Encounter (Signed)
-----   Message from Jonathon Bellows, MD sent at 04/01/2018 11:10 AM EDT ----- Good morning   Noted message below. Yes , I agree with you that the cologuard is only for average risk colon cancer screening in those with no family history of primary colon cancer or polyps . A negative test with a familly history cannot be interpreted .   On the other hand I always tell patients who request cologuard that the gold standard is colonoscopy. The ACG recommends it as the "preferred option".   In addition if the cologuard is positive then the colonoscopy is coded as "diagnostic" and not " screening " , it may or may not be then covered by his insurance . He may end up with a bill .    We received a message that he didn't want to follow up because he received some bills. I forwarded that message to our practice manager since its beyond my control.   Do let me know if I can be of any further assistance  Regards  Kiran    ----- Message ----- From: Leone Haven, MD Sent: 04/01/2018   9:06 AM To: Jonathon Bellows, MD  Hi Dr Vicente Males,   I saw Mr Ealy today for follow-up and he requested to do cologuard for colon cancer screening. He has done this previously, though reports that his brother had prostate cancer that became metastatic and subsequently developed a cancer in his colon that the patient is unsure if it was a primary colon or a metastasis. I know cologuard is not an option if someone has a family history of colon cancer, though I wanted to get your thoughts on its use in this patient given that the cancer in the colon potentially was not a primary colon cancer. Thanks for your help.  Randall Hiss

## 2018-04-02 ENCOUNTER — Ambulatory Visit: Payer: Medicare Other | Admitting: Gastroenterology

## 2018-04-02 NOTE — Telephone Encounter (Signed)
Patient states he will think about it and let us know

## 2018-04-02 NOTE — Telephone Encounter (Signed)
Noted  

## 2018-04-09 ENCOUNTER — Ambulatory Visit
Admission: RE | Admit: 2018-04-09 | Discharge: 2018-04-09 | Disposition: A | Discharge status: Home / Self Care | Payer: Medicare Other | Source: Ambulatory Visit | Attending: Family Medicine | Admitting: Family Medicine

## 2018-04-09 DIAGNOSIS — R590 Localized enlarged lymph nodes: Secondary | ICD-10-CM | POA: Diagnosis not present

## 2018-04-09 DIAGNOSIS — R599 Enlarged lymph nodes, unspecified: Secondary | ICD-10-CM | POA: Diagnosis not present

## 2018-04-14 ENCOUNTER — Encounter: Payer: Self-pay | Admitting: Family Medicine

## 2018-04-14 DIAGNOSIS — R590 Localized enlarged lymph nodes: Secondary | ICD-10-CM

## 2018-04-14 NOTE — Telephone Encounter (Signed)
Called and spoke with the patient regarding his results.  Discussed that there is a wide differential and that he needs to see somebody to have a biopsy completed to determine the cause.  ENT referral placed to consider open biopsy versus needle biopsy.  I spoke with our referral coordinator and forward her this message regarding the referral.

## 2018-04-15 ENCOUNTER — Encounter: Payer: Self-pay | Admitting: *Deleted

## 2018-04-15 ENCOUNTER — Ambulatory Visit (INDEPENDENT_AMBULATORY_CARE_PROVIDER_SITE_OTHER): Payer: Medicare Other | Admitting: *Deleted

## 2018-04-15 VITALS — BP 140/86 | HR 86 | Resp 18

## 2018-04-15 DIAGNOSIS — I10 Essential (primary) hypertension: Secondary | ICD-10-CM

## 2018-04-15 NOTE — Progress Notes (Signed)
Patient here for nurse visit BP check per order from 04/01/18 patient BP taken in Left arm 136/80 pulse 89, patient BP taken 15 minutes later right arm 140/86 pulse 86. Patient gave list of BP's to nurse taken once daily for 2 weeks.  Patient reports compliance with prescribed BP medications: yes  Last dose of BP medication: 7:30 AM  BP Readings from Last 3 Encounters:  04/15/18 136/80  04/01/18 (!) 150/80  04/01/18 (!) 150/80   Pulse Readings from Last 3 Encounters:  04/15/18 89  04/01/18 77  04/01/18 77

## 2018-04-15 NOTE — Progress Notes (Signed)
Blood pressure well controlled at home.  He should continue to monitor it at home and if it starts to rise let us know.

## 2018-04-15 NOTE — Telephone Encounter (Signed)
He is scheduled on 8/22 with Dr. Richardson Landry

## 2018-04-23 DIAGNOSIS — R599 Enlarged lymph nodes, unspecified: Secondary | ICD-10-CM | POA: Diagnosis not present

## 2018-04-23 DIAGNOSIS — R490 Dysphonia: Secondary | ICD-10-CM | POA: Diagnosis not present

## 2018-04-27 ENCOUNTER — Other Ambulatory Visit: Payer: Self-pay | Admitting: Otolaryngology

## 2018-04-27 DIAGNOSIS — R590 Localized enlarged lymph nodes: Secondary | ICD-10-CM

## 2018-04-29 ENCOUNTER — Encounter: Payer: Self-pay | Admitting: *Deleted

## 2018-04-29 NOTE — Progress Notes (Signed)
Mychart message sent.

## 2018-05-01 ENCOUNTER — Other Ambulatory Visit: Payer: Self-pay | Admitting: Radiology

## 2018-05-03 DIAGNOSIS — C8590 Non-Hodgkin lymphoma, unspecified, unspecified site: Secondary | ICD-10-CM

## 2018-05-03 HISTORY — DX: Non-Hodgkin lymphoma, unspecified, unspecified site: C85.90

## 2018-05-05 ENCOUNTER — Ambulatory Visit
Admission: RE | Admit: 2018-05-05 | Discharge: 2018-05-05 | Disposition: A | Discharge status: Home / Self Care | Payer: Medicare Other | Source: Ambulatory Visit | Attending: Otolaryngology | Admitting: Otolaryngology

## 2018-05-05 DIAGNOSIS — C8300 Small cell B-cell lymphoma, unspecified site: Secondary | ICD-10-CM | POA: Diagnosis not present

## 2018-05-05 DIAGNOSIS — C8511 Unspecified B-cell lymphoma, lymph nodes of head, face, and neck: Secondary | ICD-10-CM | POA: Diagnosis not present

## 2018-05-05 DIAGNOSIS — R590 Localized enlarged lymph nodes: Secondary | ICD-10-CM

## 2018-05-05 LAB — CBC
HCT: 51.3 % (ref 40.0–52.0)
HEMOGLOBIN: 17 g/dL (ref 13.0–18.0)
MCH: 29.9 pg (ref 26.0–34.0)
MCHC: 33.1 g/dL (ref 32.0–36.0)
MCV: 90.1 fL (ref 80.0–100.0)
Platelets: 114 10*3/uL — ABNORMAL LOW (ref 150–440)
RBC: 5.69 MIL/uL (ref 4.40–5.90)
RDW: 14 % (ref 11.5–14.5)
WBC: 7.1 10*3/uL (ref 3.8–10.6)

## 2018-05-05 LAB — PROTIME-INR
INR: 1.17
PROTHROMBIN TIME: 14.8 s (ref 11.4–15.2)

## 2018-05-05 MED ORDER — MIDAZOLAM HCL 2 MG/2ML IJ SOLN
INTRAMUSCULAR | Status: AC | PRN
  Administered 2018-05-05 (×2): 1 mg via INTRAVENOUS

## 2018-05-05 MED ORDER — MIDAZOLAM HCL 5 MG/5ML IJ SOLN
INTRAMUSCULAR | Status: AC
  Filled 2018-05-05: qty 5

## 2018-05-05 MED ORDER — SODIUM CHLORIDE 0.9 % IV SOLN
INTRAVENOUS | Status: DC
  Administered 2018-05-05: 1000 mL via INTRAVENOUS

## 2018-05-05 MED ORDER — FENTANYL CITRATE (PF) 100 MCG/2ML IJ SOLN
INTRAMUSCULAR | Status: AC
  Filled 2018-05-05: qty 2

## 2018-05-05 MED ORDER — FENTANYL CITRATE (PF) 100 MCG/2ML IJ SOLN
INTRAMUSCULAR | Status: AC | PRN
  Administered 2018-05-05 (×2): 50 ug via INTRAVENOUS

## 2018-05-05 MED ORDER — HYDROCODONE-ACETAMINOPHEN 5-325 MG PO TABS: 1.0000 | ORAL_TABLET | ORAL | Status: DC | PRN

## 2018-05-05 NOTE — Procedures (Signed)
  Procedure: [place] core R cerv LAN 18g cores in saline EBL:   minimal Complications:  none immediate  See full dictation in [facility].  Dillard Cannon MD Main # [phone] Pager  [phone]

## 2018-05-08 LAB — SURGICAL PATHOLOGY

## 2018-05-12 NOTE — Telephone Encounter (Unsigned)
Copied from [place] [id number]. Topic: General - Other >> May 12, 2018 10:08 AM Bea Graff, NT wrote: Reason for CRM: Pts wife calling and states her husband would like a call from Dr. Caryl Bis today if possible to answer some questions he has about the cancer treatments and appointments he will be beginning tomorrow.

## 2018-05-13 ENCOUNTER — Inpatient Hospital Stay: Payer: Medicare Other | Attending: Internal Medicine | Admitting: Internal Medicine

## 2018-05-13 ENCOUNTER — Other Ambulatory Visit: Payer: Self-pay

## 2018-05-13 ENCOUNTER — Encounter: Payer: Self-pay | Admitting: Internal Medicine

## 2018-05-13 DIAGNOSIS — C8591 Non-Hodgkin lymphoma, unspecified, lymph nodes of head, face, and neck: Secondary | ICD-10-CM | POA: Insufficient documentation

## 2018-05-13 DIAGNOSIS — Z87891 Personal history of nicotine dependence: Secondary | ICD-10-CM

## 2018-05-13 DIAGNOSIS — C8590 Non-Hodgkin lymphoma, unspecified, unspecified site: Secondary | ICD-10-CM | POA: Insufficient documentation

## 2018-05-13 DIAGNOSIS — I10 Essential (primary) hypertension: Secondary | ICD-10-CM

## 2018-05-13 NOTE — Assessment & Plan Note (Signed)
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  Negative for mantle cell as cyclin D1 negative.  #Discussed that in general would recommend scan for further evaluation; and patient will likely need excisional lymph node biopsy.  Also discussed regarding a bone marrow biopsy for staging purposes/diagnostic purposes.  #Discussed that in general low-grade lymphomas are hard to cure; however can go into remission with treatments.   #Discussed that the treatment options for lymphoma would depend upon-the diagnosis; and would in general include chemo-immunotherapy; targeted agents- ibrutinib.  However, hard to recommended treatment plan-unless definitive diagnosis available.  Also mentioned that certain low-grade lymphomas could be considered for surveillance.  #Would recommend CBC CMP LDH hepatitis panel hepatitis C; M protein.   #However, after lengthy discussion patient is interested in getting a second opinion at [facility].  He will reach out to his PCP regarding a referral to [facility] oncology.  However he will let us know by the end of next week regarding his plans.  Thank you Dr.Bennett for allowing me to participate in the care of your pleasant patient. Please do not hesitate to contact me with questions or concerns in the interim.  Cc; Dr.Sonnenberg/Bennett

## 2018-05-13 NOTE — Progress Notes (Signed)
[facility] CONSULT NOTE  Patient Care Team: Leone Haven, MD as PCP - General (Family Medicine)  CHIEF COMPLAINTS/PURPOSE OF CONSULTATION:  LYMPHOMA  #  Oncology History   #September 2019 -right neck lymph node ultrasound-guided biopsy; CLL/SLL as well as other entities that can  occasionally be CD5 positive such as lymphoplasmacytic lymphoma and marginal zone lymphoma.  #      Malignant lymphoma, low grade (HCC)     HISTORY OF PRESENTING ILLNESS:  Cameron Miles 67 y.o.  male with no significant past medical history except for hypertension noted to have swelling of the neck right side approximately 3 to 4 months ago.  Patient brought this to his primary care physician attention; had ultrasound of the neck that showed bilateral 2 to 3 cm lymph nodes.  He was further evaluated by ENT Dr. Richardson Landry who recommended a ultrasound-guided core biopsy.  Biopsy positive for low-grade B-cell lymphoma.  He is here to discuss further regarding evaluation and treatment options  Review of Systems  Constitutional: Negative for chills, diaphoresis, fever, malaise/fatigue and weight loss.  HENT: Negative for nosebleeds and sore throat.   Eyes: Negative for double vision.  Respiratory: Negative for cough, hemoptysis, sputum production, shortness of breath and wheezing.   Cardiovascular: Negative for chest pain, palpitations, orthopnea and leg swelling.  Gastrointestinal: Negative for abdominal pain, blood in stool, constipation, diarrhea, heartburn, melena, nausea and vomiting.  Genitourinary: Negative for dysuria, frequency and urgency.  Musculoskeletal: Positive for back pain and joint pain.  Skin: Negative.  Negative for itching and rash.  Neurological: Negative for dizziness, tingling, focal weakness, weakness and headaches.  Endo/Heme/Allergies: Does not bruise/bleed easily.  Psychiatric/Behavioral: Negative for depression. The patient is nervous/anxious. The patient does  not have insomnia.      MEDICAL HISTORY:  Past Medical History:  Diagnosis Date  . Allergy   . Chicken pox   . Hyperlipidemia   . Hypertension   . Kidney stones   . Lymphoma ([place]) 05/2018    SURGICAL HISTORY: Past Surgical History:  Procedure Laterality Date  . LITHOTRIPSY  1985  . LYMPH NODE BIOPSY      SOCIAL HISTORY: Social History   Socioeconomic History  . Marital status: Married    Spouse name: Not on file  . Number of children: Not on file  . Years of education: Not on file  . Highest education level: Not on file  Occupational History  . Not on file  Social Needs  . Financial resource strain: Not hard at all  . Food insecurity:    Worry: Never true    Inability: Never true  . Transportation needs:    Medical: No    Non-medical: No  Tobacco Use  . Smoking status: Former Smoker    Last attempt to quit: 08/08/1969    Years since quitting: 48.7  . Smokeless tobacco: Never Used  Substance and Sexual Activity  . Alcohol use: Yes    Alcohol/week: 7.0 standard drinks    Types: 7 Standard drinks or equivalent per week  . Drug use: No  . Sexual activity: Yes  Lifestyle  . Physical activity:    Days per week: 3 days    Minutes per session: 30 min  . Stress: Not on file  Relationships  . Social connections:    Talks on phone: Patient refused    Gets together: Patient refused    Attends religious service: Patient refused    Active member of club or organization:  Patient refused    Attends meetings of clubs or organizations: Patient refused    Relationship status: Patient refused  . Intimate partner violence:    Fear of current or ex partner: No    Emotionally abused: No    Physically abused: No    Forced sexual activity: No  Other Topics Concern  . Not on file  Social History Narrative   Retd. Mental health professional/case manager; remote hx of smoking; couple of wines at night. Lives in  [place].     FAMILY HISTORY: Family History  Problem  Relation Age of Onset  . Stroke Mother   . Prostate cancer Brother   . Hyperlipidemia Brother   . Heart disease Brother   . Stroke Brother   . Kidney disease Brother   . Mental illness Brother   . Melanoma Brother   . Bladder Cancer Neg Hx   . Kidney cancer Neg Hx     ALLERGIES:  has No Known Allergies.  MEDICATIONS:  Current Outpatient Medications  Medication Sig Dispense Refill  . aspirin EC 81 MG tablet Take 81 mg by mouth daily.    . cloNIDine (CATAPRES) 0.1 MG tablet TAKE 1 TABLET BY MOUTH TWICE A DAY 180 tablet 1  . diphenhydrAMINE (SOMINEX) 25 MG tablet Take 25 mg by mouth at bedtime as needed for sleep.    . Loratadine (CLARITIN) 10 MG CAPS Take by mouth as needed.      No current facility-administered medications for this visit.       Marland Kitchen  PHYSICAL EXAMINATION: ECOG PERFORMANCE STATUS: 1 - Symptomatic but completely ambulatory  Vitals:   05/13/18 1515  BP: (!) 171/89  Pulse: (!) 103  Resp: 18  Temp: 97.6 F (36.4 C)   Filed Weights   05/13/18 1531  Weight: 174 lb (78.9 kg)    Physical Exam  Constitutional: He is oriented to person, place, and time and well-developed, well-nourished, and in no distress.  HENT:  Head: Normocephalic and atraumatic.  Mouth/Throat: Oropharynx is clear and moist. No oropharyngeal exudate.  Eyes: Pupils are equal, round, and reactive to light.  Neck: Normal range of motion. Neck supple.  Bilateral neck adenopathy noted up to 2 cm in size.  Cardiovascular: Normal rate and regular rhythm.  Pulmonary/Chest: No respiratory distress. He has no wheezes.  Abdominal: Soft. Bowel sounds are normal. He exhibits no distension and no mass. There is no tenderness. There is no rebound and no guarding.  Positive for splenomegaly.  Musculoskeletal: Normal range of motion. He exhibits no edema or tenderness.  Lymphadenopathy:  Bilateral neck adenopathy.  Approximate 2 cm in size  Bilateral axilla adenopathy left more than right.   Approximately 1 cm in size  Inguinal lymphadenopathy right side approximately 1 cm.    Neurological: He is alert and oriented to person, place, and time.  Skin: Skin is warm.  Psychiatric: Affect normal.     LABORATORY DATA:  I have reviewed the data as listed Lab Results  Component Value Date   WBC 7.1 05/05/2018   HGB 17.0 05/05/2018   HCT 51.3 05/05/2018   MCV 90.1 05/05/2018   PLT 114 (L) 05/05/2018   Recent Labs    06/30/17 0953 10/02/17 1027 12/19/17 0807  NA 136  --  136  K 4.4  --  3.8  CL 103  --  107  CO2 25  --  22  GLUCOSE 104*  --  99  BUN 12  --  10  CREATININE 0.68  --  0.84  CALCIUM 9.2  --  8.9  GFRNONAA  --   --  >60  GFRAA  --   --  >60  PROT 6.6 7.6 7.4  ALBUMIN 3.8 4.1 4.0  AST 47* 57* 40  ALT 34 43 30  ALKPHOS 80 88 85  BILITOT 1.9* 2.4* 1.7*  BILIDIR  --  0.8*  --     RADIOGRAPHIC STUDIES: I have personally reviewed the radiological images as listed and agreed with the findings in the report. [place] Core Biopsy (lymph Nodes)  Result Date: 05/05/2018 CLINICAL DATA:  Bilateral cervical adenopathy, painless. No known primary malignancy. EXAM: ULTRASOUND GUIDED CORE BIOPSY OF RIGHT CERVICAL ADENOPATHY MEDICATIONS: Intravenous Fentanyl and Versed were administered as conscious sedation during continuous monitoring of the patient's level of consciousness and physiological / cardiorespiratory status by the radiology RN, with a total moderate sedation time of 15 minutes. PROCEDURE: The procedure, risks, benefits, and alternatives were explained to the patient. Questions regarding the procedure were encouraged and answered. The patient understands and consents to the procedure. Survey ultrasound of the right neck was performed. Dominant right cervical lymph node 3.3 x 1.4 cm was again localized and an appropriate skin entry site was determined and marked. The operative field was prepped with chlorhexidine in a sterile fashion, and a sterile drape was applied  covering the operative field. A sterile gown and sterile gloves were used for the procedure. Local anesthesia was provided with 1% Lidocaine. Under real-time ultrasound guidance, coaxial 18-gauge core biopsy samples were obtained, submitted in saline to surgical pathology. The guide needle was removed. Postprocedure scans show no hemorrhage or other apparent complication. The patient tolerated the procedure well. COMPLICATIONS: None. FINDINGS: Right cervical adenopathy up to 3.3 x 1.4 cm localized. Representative core biopsy samples obtained as above. IMPRESSION: Technically successful ultrasound-guided core biopsy, right cervical adenopathy. Electronically Signed   By: Lucrezia Europe M.D.   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  Negative for mantle cell as cyclin D1 negative.  #Discussed that in general would recommend scan for further evaluation; and patient will likely need excisional lymph node biopsy.  Also discussed regarding a bone marrow biopsy for staging purposes/diagnostic purposes.  #Discussed that in general low-grade lymphomas are hard to cure; however can go into remission with treatments.   #Discussed that the treatment options for lymphoma would depend upon-the diagnosis; and would in general include chemo-immunotherapy; targeted agents- ibrutinib.  However, hard to recommended treatment plan-unless definitive diagnosis available.  Also mentioned that certain low-grade lymphomas could be considered for surveillance.  #Would recommend CBC CMP LDH hepatitis panel hepatitis C; M protein.   #However, after lengthy discussion patient is interested in getting a second opinion at [facility].  He will reach out to his PCP regarding a  referral to [facility] oncology.  However he will let us know by the end of next week regarding his plans.  Thank you Dr.Bennett for allowing me to participate in the care of your pleasant patient. Please do not hesitate to contact me with questions or concerns in the interim.  Cc; Dr.Sonnenberg/Bennett  All questions were answered. The patient knows to call the clinic with any problems, questions or concerns.    Cammie Sickle, MD 05/13/2018 4:55 PM

## 2018-05-15 ENCOUNTER — Other Ambulatory Visit: Payer: Self-pay | Admitting: Family

## 2018-05-15 DIAGNOSIS — C8590 Non-Hodgkin lymphoma, unspecified, unspecified site: Secondary | ICD-10-CM

## 2018-05-15 NOTE — Telephone Encounter (Signed)
Spoke to patient he is very anxious due to diagnosed with low grade lymphoma  Lymph node neck.  He saw Dr Rogue Bussing and he wants to be referred to [facility] at [facility] for a second opinion. He would like to speak with you if at all posssible.  He doesn't want to wait until Dr Caryl Bis comes back for referral .   [phone] home

## 2018-05-19 NOTE — Telephone Encounter (Signed)
Received call from Hematology/Oncology at [facility] to notify they forwarded the referral to [facility]. If you need to follow up for appt please contact Santiago Glad at [phone]. You do not need to resend the referral.

## 2018-05-21 NOTE — Telephone Encounter (Signed)
He is scheduled tomorrow 9/20 at [facility].

## 2018-05-22 DIAGNOSIS — C8590 Non-Hodgkin lymphoma, unspecified, unspecified site: Secondary | ICD-10-CM | POA: Diagnosis not present

## 2018-05-22 DIAGNOSIS — Z114 Encounter for screening for human immunodeficiency virus [HIV]: Secondary | ICD-10-CM | POA: Diagnosis not present

## 2018-05-22 DIAGNOSIS — C8512 Unspecified B-cell lymphoma, intrathoracic lymph nodes: Secondary | ICD-10-CM | POA: Diagnosis not present

## 2018-05-22 DIAGNOSIS — Z1159 Encounter for screening for other viral diseases: Secondary | ICD-10-CM | POA: Diagnosis not present

## 2018-05-27 ENCOUNTER — Other Ambulatory Visit: Payer: Self-pay | Admitting: Family Medicine

## 2018-05-28 NOTE — Telephone Encounter (Signed)
Last OV 04/01/2018   Last refilled 01/05/2018 disp 180 with 1 refill   Sent to PCP to advise

## 2018-06-04 DIAGNOSIS — Z79899 Other long term (current) drug therapy: Secondary | ICD-10-CM | POA: Diagnosis not present

## 2018-06-04 DIAGNOSIS — C8512 Unspecified B-cell lymphoma, intrathoracic lymph nodes: Secondary | ICD-10-CM | POA: Diagnosis not present

## 2018-06-10 DIAGNOSIS — C8510 Unspecified B-cell lymphoma, unspecified site: Secondary | ICD-10-CM | POA: Diagnosis not present

## 2018-06-15 DIAGNOSIS — I10 Essential (primary) hypertension: Secondary | ICD-10-CM | POA: Diagnosis not present

## 2018-06-15 DIAGNOSIS — C8590 Non-Hodgkin lymphoma, unspecified, unspecified site: Secondary | ICD-10-CM | POA: Diagnosis not present

## 2018-06-15 DIAGNOSIS — C9110 Chronic lymphocytic leukemia of B-cell type not having achieved remission: Secondary | ICD-10-CM | POA: Diagnosis not present

## 2018-06-26 DIAGNOSIS — C9110 Chronic lymphocytic leukemia of B-cell type not having achieved remission: Secondary | ICD-10-CM | POA: Diagnosis not present

## 2018-08-04 DIAGNOSIS — Z23 Encounter for immunization: Secondary | ICD-10-CM | POA: Diagnosis not present

## 2018-08-04 DIAGNOSIS — C9110 Chronic lymphocytic leukemia of B-cell type not having achieved remission: Secondary | ICD-10-CM | POA: Diagnosis not present

## 2018-10-26 ENCOUNTER — Ambulatory Visit (INDEPENDENT_AMBULATORY_CARE_PROVIDER_SITE_OTHER): Payer: Medicare Other

## 2018-10-26 ENCOUNTER — Encounter: Payer: Self-pay | Admitting: Family Medicine

## 2018-10-26 ENCOUNTER — Telehealth: Payer: Self-pay

## 2018-10-26 ENCOUNTER — Ambulatory Visit (INDEPENDENT_AMBULATORY_CARE_PROVIDER_SITE_OTHER): Payer: Medicare Other | Admitting: Family Medicine

## 2018-10-26 VITALS — BP 140/80 | HR 89 | Temp 97.6°F | Wt 175.4 lb

## 2018-10-26 DIAGNOSIS — R590 Localized enlarged lymph nodes: Secondary | ICD-10-CM

## 2018-10-26 DIAGNOSIS — R059 Cough, unspecified: Secondary | ICD-10-CM | POA: Insufficient documentation

## 2018-10-26 DIAGNOSIS — R05 Cough: Secondary | ICD-10-CM | POA: Diagnosis not present

## 2018-10-26 NOTE — Assessment & Plan Note (Signed)
Possibly viral related.  Given crackles on exam we will obtain a chest x-ray.  If chest x-ray is negative I would defer further treatment given his recent improvement.  If chest x-ray is positive for pneumonia we will treat appropriately.  I advised the patient that I would review his x-ray and if noted to be positive for pneumonia prior to radiology read we would contact him.

## 2018-10-26 NOTE — Telephone Encounter (Signed)
Patient called in requesting to see Dr. Caryl Bis. Patient said that he is currently a cancer patient being treated for Leukemia at the [place] in [facility] and said that the [place] recommended that he schedule an appt with his PCP.  Patient said that he has been having chest congestion over the last several weeks.  Patient said that he has had a constant cough.  Patient says that he sometimes coughs up gray mucus and has been taking Mucinex.  Patient denies having any shortness of breath.  Patient said that his temperature is 98 but is usually around 97.6.  Patient refused to see any other provider and requested to be seen only by Dr. Caryl Bis.  Lead Clinic RN put patient on Dr. Ellen Henri schedule for available appt today at 4:30 pm.

## 2018-10-26 NOTE — Progress Notes (Signed)
Tommi Rumps, MD Phone: [phone]  Cameron Miles is a 68 y.o. male who presents today for same-day visit.  CC: Cough  Patient notes onset of symptoms about 2 weeks ago.  He has had sinus congestion and chest congestion.  Sinus congestion slightly worse.  T-max 98.6 F.  He reports his typical temperature runs 97.6 F.  He notes over the last 3 to 4 days his congestion has improved.  No sick contacts.  Cough is productive of green mucus.  Some mild bloody nasal discharge.  He notes he called his cancer doctors office and the nurse there advised him to see me today.  He notes no weight loss.  No drenching night sweats.  Social History   Tobacco Use  Smoking Status Former Smoker  . Last attempt to quit: 08/08/1969  . Years since quitting: 49.2  Smokeless Tobacco Never Used     ROS see history of present illness  Objective  Physical Exam Vitals:   10/26/18 1633  BP: 140/80  Pulse: 89  Temp: 97.6 F (36.4 C)  SpO2: 96%    BP Readings from Last 3 Encounters:  10/26/18 140/80  05/13/18 (!) 171/89  05/05/18 131/71   Wt Readings from Last 3 Encounters:  10/26/18 175 lb 6.4 oz (79.6 kg)  05/13/18 174 lb (78.9 kg)  05/05/18 173 lb 4.5 oz (78.6 kg)    Physical Exam Constitutional:      General: He is not in acute distress.    Appearance: He is not diaphoretic.  HENT:     Head: Normocephalic and atraumatic.     Right Ear: Tympanic membrane normal.     Left Ear: Tympanic membrane normal.     Mouth/Throat:     Mouth: Mucous membranes are moist.     Pharynx: Oropharynx is clear.  Eyes:     Conjunctiva/sclera: Conjunctivae normal.     Pupils: Pupils are equal, round, and reactive to light.  Cardiovascular:     Rate and Rhythm: Normal rate and regular rhythm.     Heart sounds: Normal heart sounds.  Pulmonary:     Effort: Pulmonary effort is normal. No respiratory distress.     Breath sounds: No wheezing.     Comments: Mild crackles noted left lower lung field  greater than right lower lung field Lymphadenopathy:     Cervical: Cervical adenopathy (Chronic) present.  Skin:    General: Skin is warm and dry.  Neurological:     Mental Status: He is alert.      Assessment/Plan: Please see individual problem list.  Cough Possibly viral related.  Given crackles on exam we will obtain a chest x-ray.  If chest x-ray is negative I would defer further treatment given his recent improvement.  If chest x-ray is positive for pneumonia we will treat appropriately.  I advised the patient that I would review his x-ray and if noted to be positive for pneumonia prior to radiology read we would contact him.  Cervical lymphadenopathy Related to CLL.  Chronic and appears to be unchanged on exam.  He will continue to follow with his oncologist.    Orders Placed This Encounter  Procedures  . DG Chest 2 View    Standing Status:   Future    Number of Occurrences:   1    Standing Expiration Date:   12/25/2019    Order Specific Question:   Reason for Exam (SYMPTOM  OR DIAGNOSIS REQUIRED)    Answer:   cough, crackles left  lower lung field >right lower lung field    Order Specific Question:   Preferred imaging location?    Answer:   [employer] Specific Question:   Radiology Contrast Protocol - do NOT remove file path    Answer:   [email or web address]    No orders of the defined types were placed in this encounter.    Tommi Rumps, MD [place]

## 2018-10-26 NOTE — Assessment & Plan Note (Signed)
Related to CLL.  Chronic and appears to be unchanged on exam.  He will continue to follow with his oncologist.

## 2018-10-26 NOTE — Patient Instructions (Signed)
Nice to see you. We will get a chest x-ray today and contact you with the results. If you develop shortness of breath or worsening symptoms please be reevaluated.

## 2018-10-27 NOTE — Telephone Encounter (Signed)
Pt notified of CXR result via mychart. Unable to document on result note.

## 2018-11-03 DIAGNOSIS — C8300 Small cell B-cell lymphoma, unspecified site: Secondary | ICD-10-CM | POA: Diagnosis not present

## 2018-11-03 DIAGNOSIS — C9110 Chronic lymphocytic leukemia of B-cell type not having achieved remission: Secondary | ICD-10-CM | POA: Diagnosis not present

## 2018-11-05 NOTE — Telephone Encounter (Signed)
Opened in error

## 2018-12-25 ENCOUNTER — Other Ambulatory Visit: Payer: Self-pay | Admitting: Family Medicine

## 2018-12-25 NOTE — Telephone Encounter (Signed)
Last OV 10/26/2018  Last refilled 05/29/2018 disp 180 with 1 refill   Next OV 02/12/2019  Sent to PCP for approval

## 2019-01-05 DIAGNOSIS — C8300 Small cell B-cell lymphoma, unspecified site: Secondary | ICD-10-CM | POA: Diagnosis not present

## 2019-02-12 ENCOUNTER — Ambulatory Visit: Payer: Medicare Other | Admitting: Family Medicine

## 2019-03-08 DIAGNOSIS — C8300 Small cell B-cell lymphoma, unspecified site: Secondary | ICD-10-CM | POA: Diagnosis not present

## 2019-03-09 DIAGNOSIS — R7989 Other specified abnormal findings of blood chemistry: Secondary | ICD-10-CM | POA: Diagnosis not present

## 2019-03-09 DIAGNOSIS — C8300 Small cell B-cell lymphoma, unspecified site: Secondary | ICD-10-CM | POA: Diagnosis not present

## 2019-03-09 DIAGNOSIS — G4700 Insomnia, unspecified: Secondary | ICD-10-CM | POA: Diagnosis not present

## 2019-03-23 ENCOUNTER — Other Ambulatory Visit: Payer: Self-pay | Admitting: Family Medicine

## 2019-04-05 ENCOUNTER — Ambulatory Visit: Payer: Medicare Other

## 2019-04-05 ENCOUNTER — Ambulatory Visit: Payer: Medicare Other | Admitting: Family Medicine

## 2019-04-27 ENCOUNTER — Ambulatory Visit: Payer: Medicare Other

## 2019-04-27 ENCOUNTER — Ambulatory Visit: Payer: Medicare Other | Admitting: Family Medicine

## 2019-05-07 DIAGNOSIS — Z1329 Encounter for screening for other suspected endocrine disorder: Secondary | ICD-10-CM | POA: Diagnosis not present

## 2019-05-07 DIAGNOSIS — B999 Unspecified infectious disease: Secondary | ICD-10-CM | POA: Diagnosis not present

## 2019-05-07 DIAGNOSIS — I10 Essential (primary) hypertension: Secondary | ICD-10-CM | POA: Diagnosis not present

## 2019-05-07 DIAGNOSIS — C9111 Chronic lymphocytic leukemia of B-cell type in remission: Secondary | ICD-10-CM | POA: Diagnosis not present

## 2019-05-07 DIAGNOSIS — R591 Generalized enlarged lymph nodes: Secondary | ICD-10-CM | POA: Diagnosis not present

## 2019-05-07 DIAGNOSIS — Z2089 Contact with and (suspected) exposure to other communicable diseases: Secondary | ICD-10-CM | POA: Diagnosis not present

## 2019-05-07 DIAGNOSIS — B370 Candidal stomatitis: Secondary | ICD-10-CM | POA: Diagnosis not present

## 2019-05-07 DIAGNOSIS — R319 Hematuria, unspecified: Secondary | ICD-10-CM | POA: Diagnosis not present

## 2019-05-07 DIAGNOSIS — R509 Fever, unspecified: Secondary | ICD-10-CM | POA: Diagnosis not present

## 2019-05-07 DIAGNOSIS — Z91013 Allergy to seafood: Secondary | ICD-10-CM | POA: Diagnosis not present

## 2019-05-07 DIAGNOSIS — Z130 Encounter for screening for diseases of the blood and blood-forming organs and certain disorders involving the immune mechanism: Secondary | ICD-10-CM | POA: Diagnosis not present

## 2019-05-07 DIAGNOSIS — Z13820 Encounter for screening for osteoporosis: Secondary | ICD-10-CM | POA: Diagnosis not present

## 2019-05-18 ENCOUNTER — Telehealth: Payer: Self-pay | Admitting: Family Medicine

## 2019-05-18 DIAGNOSIS — I10 Essential (primary) hypertension: Secondary | ICD-10-CM | POA: Diagnosis not present

## 2019-05-18 DIAGNOSIS — Z23 Encounter for immunization: Secondary | ICD-10-CM | POA: Diagnosis not present

## 2019-05-18 DIAGNOSIS — E559 Vitamin D deficiency, unspecified: Secondary | ICD-10-CM | POA: Diagnosis not present

## 2019-05-18 DIAGNOSIS — C91Z0 Other lymphoid leukemia not having achieved remission: Secondary | ICD-10-CM | POA: Diagnosis not present

## 2019-05-18 DIAGNOSIS — B370 Candidal stomatitis: Secondary | ICD-10-CM | POA: Diagnosis not present

## 2019-05-18 DIAGNOSIS — D72820 Lymphocytosis (symptomatic): Secondary | ICD-10-CM | POA: Diagnosis not present

## 2019-05-18 DIAGNOSIS — R591 Generalized enlarged lymph nodes: Secondary | ICD-10-CM | POA: Diagnosis not present

## 2019-05-18 DIAGNOSIS — C9111 Chronic lymphocytic leukemia of B-cell type in remission: Secondary | ICD-10-CM | POA: Diagnosis not present

## 2019-05-18 NOTE — Telephone Encounter (Signed)
Noted. I will forward to [place] to see if there is anything we need to do regarding this.

## 2019-05-18 NOTE — Telephone Encounter (Signed)
Dr. Caryl Bis has been removed as the patient's provider.

## 2019-05-18 NOTE — Telephone Encounter (Signed)
Per the patient's my chart message he no longer wants to be a part of the [facility] system ,However he did like his doctor.    Reason for Cancellation: Unhappy/Changed Provider  Patient Comments: I have no complaints with Dr. Caryl Bis. However I find it nearly impossible to deal with the [facility] system. I never get a call back when I leave a message, and recently spent an hour on the phone trying to set up an appointment without success.

## 2019-05-18 NOTE — Telephone Encounter (Signed)
Per the patient's my chart message he no longer wants to be a part of the [facility] system ,However he did like his doctor.    Reason for Cancellation: Unhappy/Changed Provider  Patient Comments: I have no complaints with Dr. Caryl Miles.  However I find it nearly impossible to deal with the [facility] system. I never get a call back when I leave a message, and recently spent an hour on the phone trying to set up an appointment without success.

## 2019-05-18 NOTE — Telephone Encounter (Signed)
Noted  

## 2019-06-07 ENCOUNTER — Ambulatory Visit: Payer: Medicare Other | Admitting: Family Medicine

## 2019-06-07 ENCOUNTER — Ambulatory Visit: Payer: Medicare Other

## 2019-06-14 DIAGNOSIS — C8300 Small cell B-cell lymphoma, unspecified site: Secondary | ICD-10-CM | POA: Diagnosis not present

## 2019-06-18 DIAGNOSIS — C9111 Chronic lymphocytic leukemia of B-cell type in remission: Secondary | ICD-10-CM | POA: Diagnosis not present

## 2019-06-18 DIAGNOSIS — E559 Vitamin D deficiency, unspecified: Secondary | ICD-10-CM | POA: Diagnosis not present

## 2019-06-18 DIAGNOSIS — C91Z0 Other lymphoid leukemia not having achieved remission: Secondary | ICD-10-CM | POA: Diagnosis not present

## 2019-06-18 DIAGNOSIS — T7840XS Allergy, unspecified, sequela: Secondary | ICD-10-CM | POA: Diagnosis not present

## 2019-06-18 DIAGNOSIS — B370 Candidal stomatitis: Secondary | ICD-10-CM | POA: Diagnosis not present

## 2019-06-18 DIAGNOSIS — A770 Spotted fever due to Rickettsia rickettsii: Secondary | ICD-10-CM | POA: Diagnosis not present

## 2019-06-18 DIAGNOSIS — R509 Fever, unspecified: Secondary | ICD-10-CM | POA: Diagnosis not present

## 2019-06-18 DIAGNOSIS — M25561 Pain in right knee: Secondary | ICD-10-CM | POA: Diagnosis not present

## 2019-06-18 DIAGNOSIS — M25562 Pain in left knee: Secondary | ICD-10-CM | POA: Diagnosis not present

## 2019-06-18 DIAGNOSIS — Z91013 Allergy to seafood: Secondary | ICD-10-CM | POA: Diagnosis not present

## 2019-06-18 DIAGNOSIS — K149 Disease of tongue, unspecified: Secondary | ICD-10-CM | POA: Diagnosis not present

## 2019-06-18 DIAGNOSIS — I10 Essential (primary) hypertension: Secondary | ICD-10-CM | POA: Diagnosis not present

## 2019-08-03 DIAGNOSIS — R21 Rash and other nonspecific skin eruption: Secondary | ICD-10-CM | POA: Diagnosis not present

## 2019-08-03 DIAGNOSIS — C9110 Chronic lymphocytic leukemia of B-cell type not having achieved remission: Secondary | ICD-10-CM | POA: Diagnosis not present

## 2019-08-03 DIAGNOSIS — C8300 Small cell B-cell lymphoma, unspecified site: Secondary | ICD-10-CM | POA: Diagnosis not present

## 2019-08-05 DIAGNOSIS — L301 Dyshidrosis [pompholyx]: Secondary | ICD-10-CM | POA: Diagnosis not present

## 2019-08-05 DIAGNOSIS — D229 Melanocytic nevi, unspecified: Secondary | ICD-10-CM | POA: Diagnosis not present

## 2019-08-05 DIAGNOSIS — L570 Actinic keratosis: Secondary | ICD-10-CM | POA: Diagnosis not present

## 2019-08-05 DIAGNOSIS — R21 Rash and other nonspecific skin eruption: Secondary | ICD-10-CM | POA: Diagnosis not present

## 2019-10-28 ENCOUNTER — Ambulatory Visit: Payer: Medicare Other | Attending: Internal Medicine

## 2019-10-28 DIAGNOSIS — Z23 Encounter for immunization: Secondary | ICD-10-CM

## 2019-10-28 NOTE — Progress Notes (Signed)
   Covid-19 Vaccination Clinic  Name:  Cameron Miles    MRN: [id number] DOB: 03/04/1951  10/28/2019  [name] was observed post Covid-19 immunization for 15 minutes without incidence. He was provided with Vaccine Information Sheet and instruction to access the V-Safe system.   [name] was instructed to call 911 with any severe reactions post vaccine: Marland Kitchen Difficulty breathing  . Swelling of your face and throat  . A fast heartbeat  . A bad rash all over your body  . Dizziness and weakness    Immunizations Administered    Name Date Dose VIS Date Route   Pfizer COVID-19 Vaccine 10/28/2019  9:58 AM 0.3 mL 08/13/2019 Intramuscular   Manufacturer: [place]   Lot: [id number]   [place]: [id number]

## 2019-11-02 DIAGNOSIS — D691 Qualitative platelet defects: Secondary | ICD-10-CM | POA: Diagnosis not present

## 2019-11-02 DIAGNOSIS — R7401 Elevation of levels of liver transaminase levels: Secondary | ICD-10-CM | POA: Insufficient documentation

## 2019-11-02 DIAGNOSIS — C8300 Small cell B-cell lymphoma, unspecified site: Secondary | ICD-10-CM | POA: Diagnosis not present

## 2019-11-09 DIAGNOSIS — G4709 Other insomnia: Secondary | ICD-10-CM | POA: Diagnosis not present

## 2019-11-09 DIAGNOSIS — C91Z0 Other lymphoid leukemia not having achieved remission: Secondary | ICD-10-CM | POA: Diagnosis not present

## 2019-11-09 DIAGNOSIS — C9111 Chronic lymphocytic leukemia of B-cell type in remission: Secondary | ICD-10-CM | POA: Diagnosis not present

## 2019-11-09 DIAGNOSIS — R945 Abnormal results of liver function studies: Secondary | ICD-10-CM | POA: Diagnosis not present

## 2019-11-09 DIAGNOSIS — K149 Disease of tongue, unspecified: Secondary | ICD-10-CM | POA: Diagnosis not present

## 2019-11-09 DIAGNOSIS — I10 Essential (primary) hypertension: Secondary | ICD-10-CM | POA: Diagnosis not present

## 2019-11-09 DIAGNOSIS — E559 Vitamin D deficiency, unspecified: Secondary | ICD-10-CM | POA: Diagnosis not present

## 2019-11-09 DIAGNOSIS — E7800 Pure hypercholesterolemia, unspecified: Secondary | ICD-10-CM | POA: Diagnosis not present

## 2019-11-09 DIAGNOSIS — J301 Allergic rhinitis due to pollen: Secondary | ICD-10-CM | POA: Diagnosis not present

## 2019-11-23 ENCOUNTER — Ambulatory Visit: Payer: Medicare Other | Attending: Internal Medicine

## 2019-11-23 DIAGNOSIS — Z23 Encounter for immunization: Secondary | ICD-10-CM

## 2019-11-23 NOTE — Progress Notes (Signed)
   Covid-19 Vaccination Clinic  Name:  Cameron Miles    MRN: [id number] DOB: November 28, 1950  11/23/2019  [name] was observed post Covid-19 immunization for 30 minutes based on pre-vaccination screening without incident. He was provided with Vaccine Information Sheet and instruction to access the V-Safe system.   [name] was instructed to call 911 with any severe reactions post vaccine: Marland Kitchen Difficulty breathing  . Swelling of face and throat  . A fast heartbeat  . A bad rash all over body  . Dizziness and weakness   Immunizations Administered    Name Date Dose VIS Date Route   Pfizer COVID-19 Vaccine 11/23/2019  9:15 AM 0.3 mL 08/13/2019 Intramuscular   Manufacturer: [place]   Lot: [id number]   [place]: [id number]

## 2020-02-08 DIAGNOSIS — C8300 Small cell B-cell lymphoma, unspecified site: Secondary | ICD-10-CM | POA: Diagnosis not present

## 2020-02-09 IMAGING — US US SOFT TISSUE HEAD/NECK
1 series · 14 of 25 positions shown · non-contrast
Comparison: None.

CLINICAL DATA: 67 y/o  M; cervical lymphadenopathy.

EXAM:
ULTRASOUND OF HEAD/NECK SOFT TISSUES
TECHNIQUE: Ultrasound examination of the head and neck soft tissues was
performed in the area of clinical concern.

[Series 1: us soft tissue head/neck · 0.07mm/px · 14 of 29 slices shown]
[im 1/29]
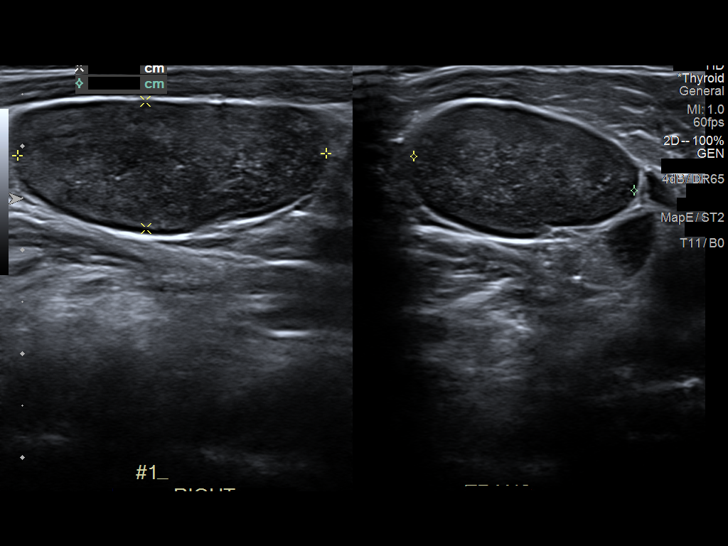
[im 3/29]
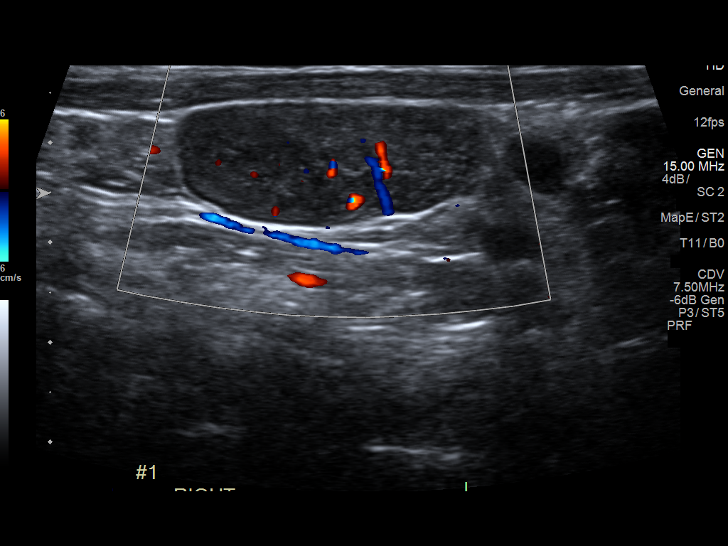
[im 5/29]
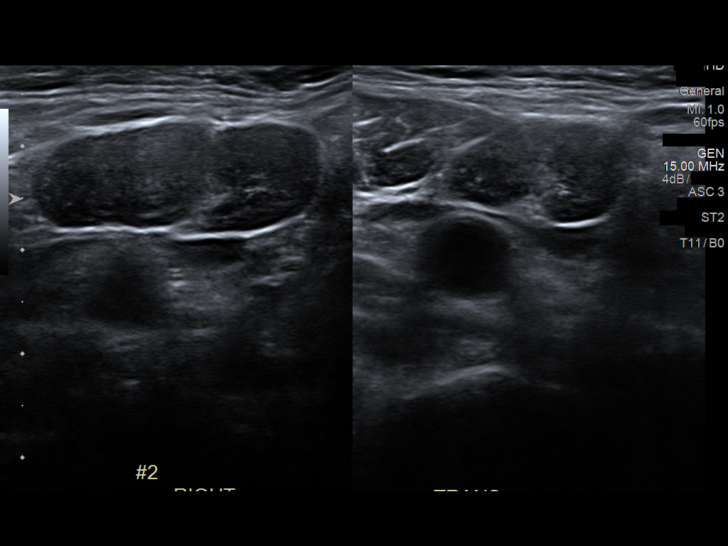
[im 8/29]
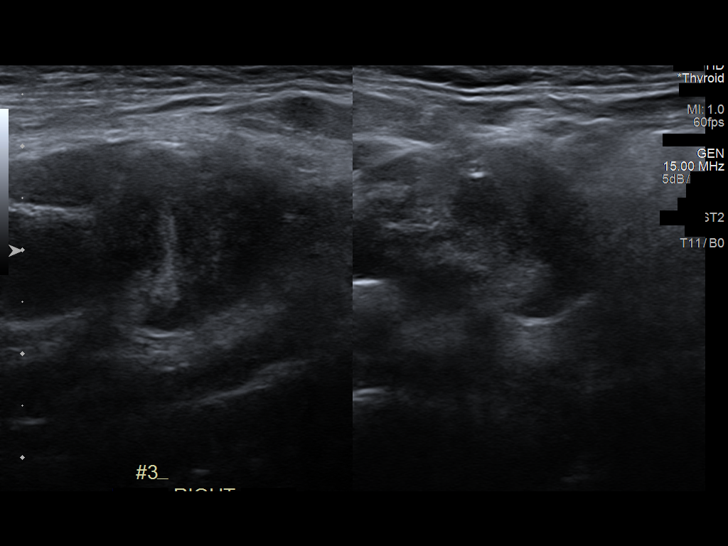
[im 10/29]
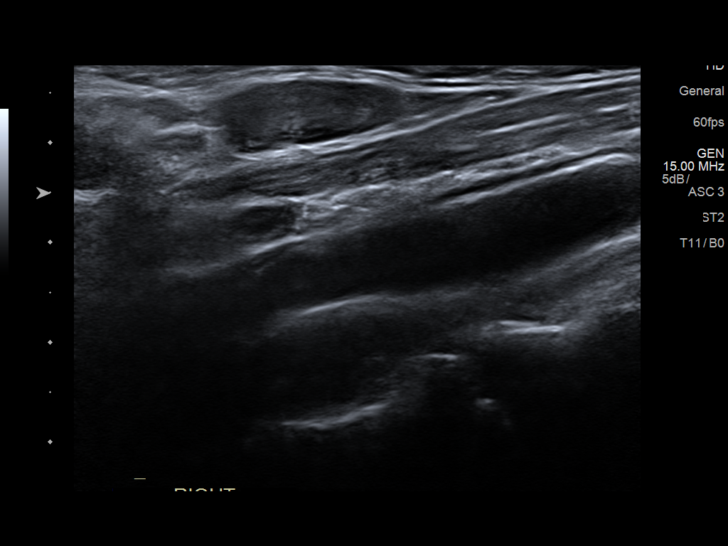
[im 11/29]
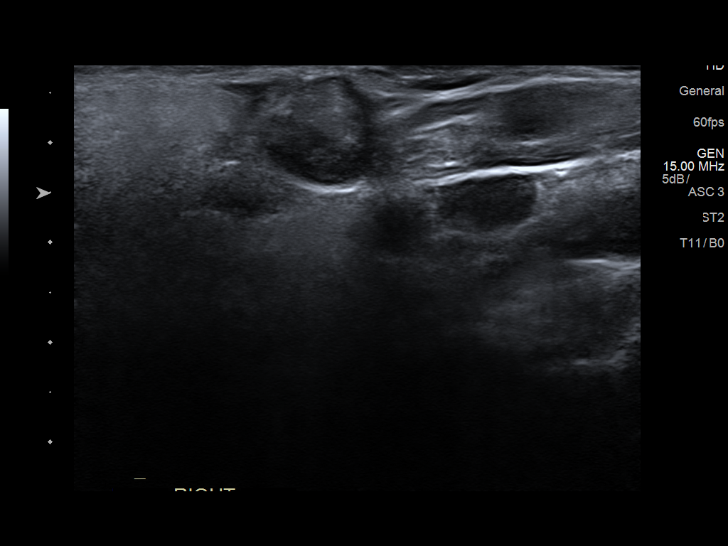
[im 13/29]
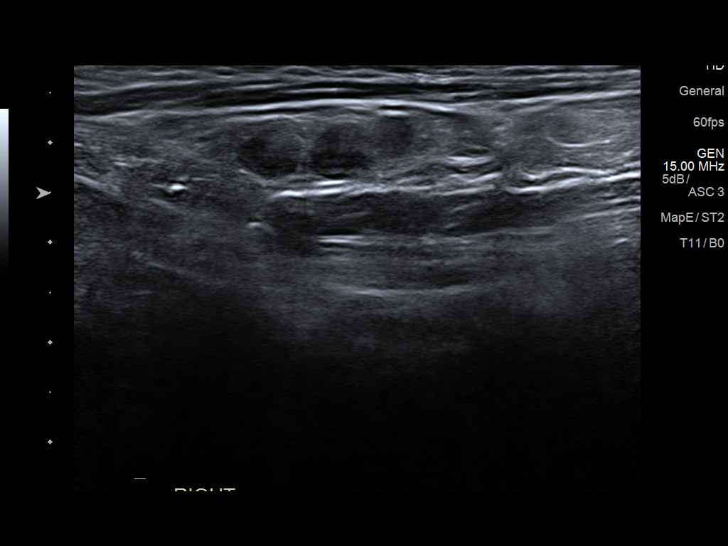
[im 16/29]
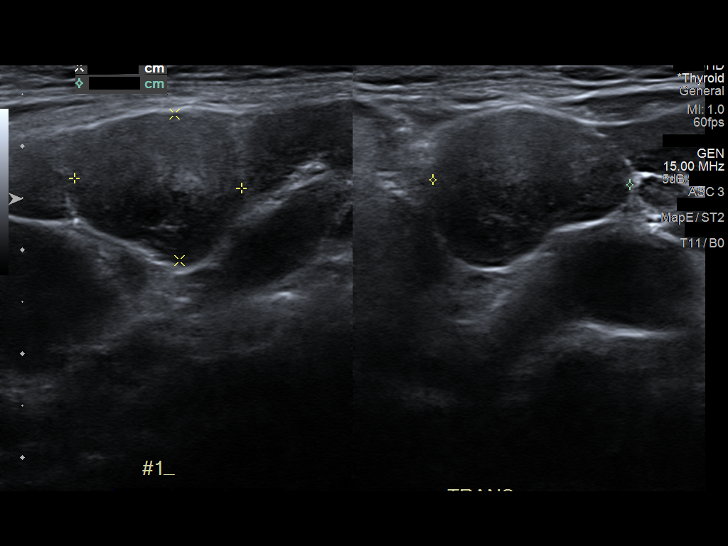
[im 18/29]
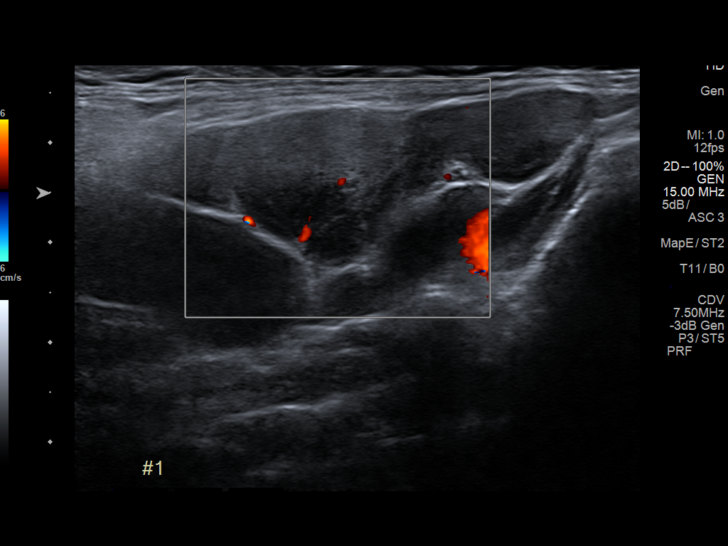
[im 19/29]
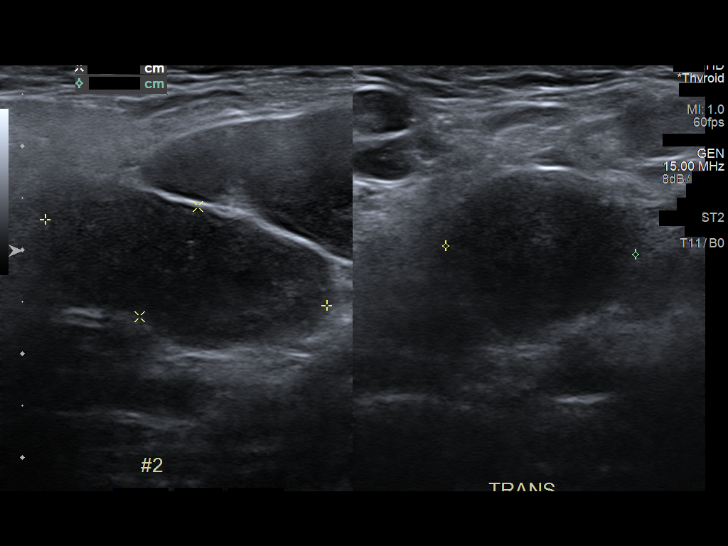
[im 22/29]
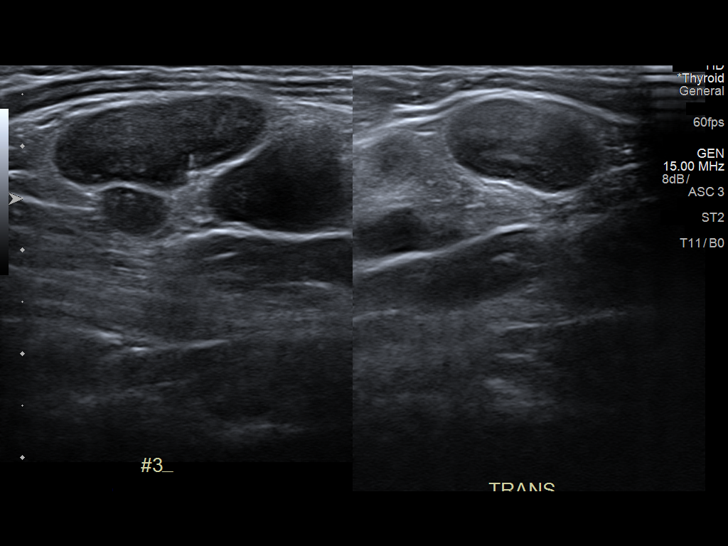
[im 24/29]
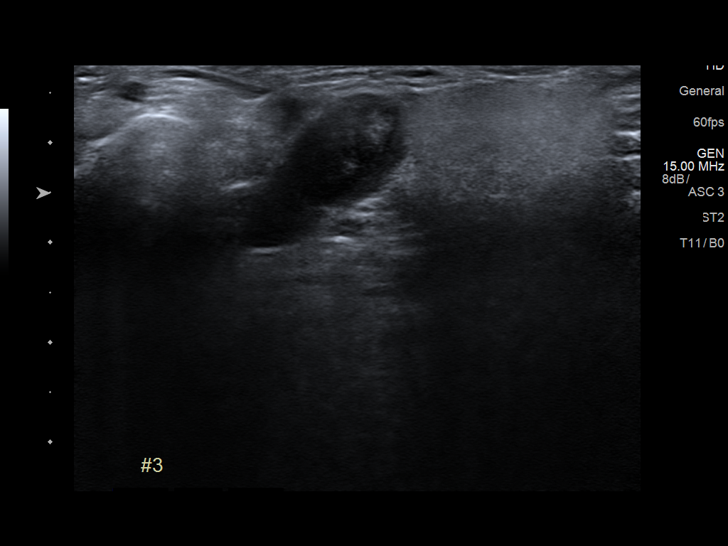
[im 26/29]
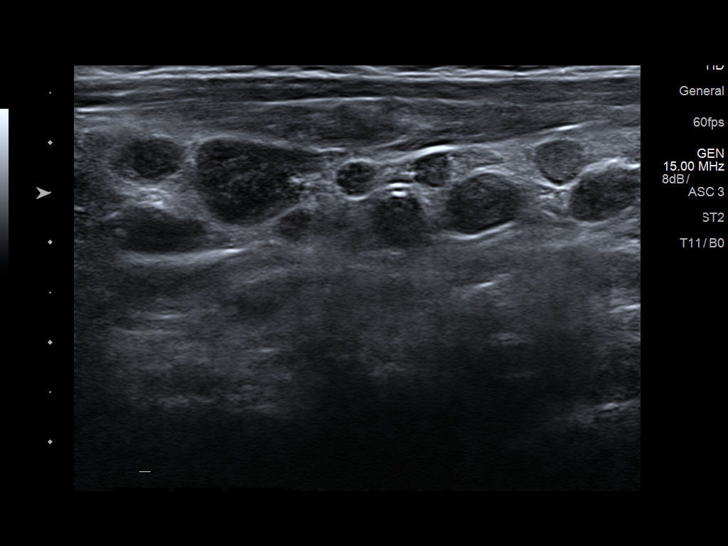
[im 29/29]
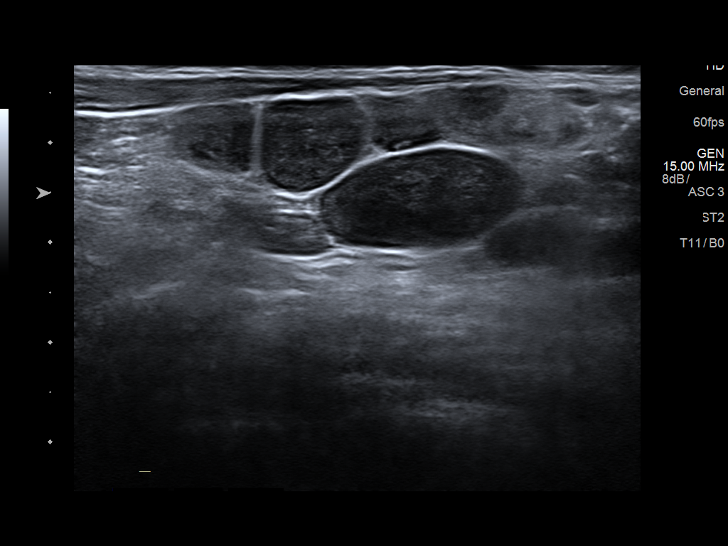

[14 of 25 positions shown; findings below may reference images not displayed]

FINDINGS: Diffuse bulky cervical lymphadenopathy in the anterior cervical
chains bilaterally. The largest node on the right measures up to
x 1.2 x 2.2 cm. The largest node on the left measures up to 2.8 x
1.2 x 1.8 cm. No findings of lymph node hyperemia, necrosis, or
surrounding inflammatory changes.
IMPRESSION: Nonspecific bilateral cervical lymphadenopathy. Differential
includes lymphoproliferative disorder, metastatic disease, systemic
inflammatory/autoimmune disease, sarcoidosis, or less likely
reactive lymphadenitis.

By: Mevo Sil M.D.

## 2020-02-10 DIAGNOSIS — L089 Local infection of the skin and subcutaneous tissue, unspecified: Secondary | ICD-10-CM | POA: Diagnosis not present

## 2020-02-10 DIAGNOSIS — J329 Chronic sinusitis, unspecified: Secondary | ICD-10-CM | POA: Diagnosis not present

## 2020-02-10 DIAGNOSIS — E7800 Pure hypercholesterolemia, unspecified: Secondary | ICD-10-CM | POA: Diagnosis not present

## 2020-02-10 DIAGNOSIS — I10 Essential (primary) hypertension: Secondary | ICD-10-CM | POA: Diagnosis not present

## 2020-02-10 DIAGNOSIS — C9111 Chronic lymphocytic leukemia of B-cell type in remission: Secondary | ICD-10-CM | POA: Diagnosis not present

## 2020-02-10 DIAGNOSIS — R945 Abnormal results of liver function studies: Secondary | ICD-10-CM | POA: Diagnosis not present

## 2020-02-10 DIAGNOSIS — J301 Allergic rhinitis due to pollen: Secondary | ICD-10-CM | POA: Diagnosis not present

## 2020-02-10 DIAGNOSIS — E559 Vitamin D deficiency, unspecified: Secondary | ICD-10-CM | POA: Diagnosis not present

## 2020-03-15 DIAGNOSIS — L821 Other seborrheic keratosis: Secondary | ICD-10-CM | POA: Diagnosis not present

## 2020-03-15 DIAGNOSIS — D229 Melanocytic nevi, unspecified: Secondary | ICD-10-CM | POA: Diagnosis not present

## 2020-03-15 DIAGNOSIS — D492 Neoplasm of unspecified behavior of bone, soft tissue, and skin: Secondary | ICD-10-CM | POA: Diagnosis not present

## 2020-04-12 ENCOUNTER — Other Ambulatory Visit: Payer: Self-pay

## 2020-04-12 ENCOUNTER — Ambulatory Visit (INDEPENDENT_AMBULATORY_CARE_PROVIDER_SITE_OTHER): Payer: Medicare Other | Admitting: Podiatry

## 2020-04-12 ENCOUNTER — Ambulatory Visit: Payer: Medicare Other

## 2020-04-12 ENCOUNTER — Encounter: Payer: Self-pay | Admitting: Podiatry

## 2020-04-12 DIAGNOSIS — I872 Venous insufficiency (chronic) (peripheral): Secondary | ICD-10-CM

## 2020-04-12 DIAGNOSIS — M778 Other enthesopathies, not elsewhere classified: Secondary | ICD-10-CM | POA: Diagnosis not present

## 2020-04-12 NOTE — Progress Notes (Signed)
He presents today chief complaint of swelling to his left leg times the past 2 to 3 months.  States that it started after his foot on a rock.  He denies any pain or any other trauma states that the only soreness occurs when the foot is swollen.  He states that he lays down and most of the swelling subsides.  States the swelling is worse obviously at the end of the day after he has been on his foot thus the foot is more sore than.  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  Objective: Vital signs are stable he is alert and oriented x3 pulses are palpable.  Neurologic sensorium is intact deep tendon reflexes are intact muscle strength is normal symmetrical.  He does have pitting edema to the left lower extremity none on the right.  Palpating the muscle of the calf does not demonstrate any pain though it is obviously more swollen than that of the right.  Palpated the popliteal fossa demonstrates a mass in the posterior aspect it is nonpulsatile this does not appear to be a aneurysm but could be a very large ganglion a Baker's cyst or even a lymph node.  Assessment I imagine all of the swelling in his left lower extremity is from somewhere more proximal particular since there is no pain about his foot at all.  Even in the place where the hit his foot does not demonstrate any pain.  Nor does it demonstrate any signs of trauma.  Plan: At this point were going to send him to [facility] health for vascular evaluation.  Hopefully they will also image the popliteal fossa

## 2020-05-09 DIAGNOSIS — C8300 Small cell B-cell lymphoma, unspecified site: Secondary | ICD-10-CM | POA: Diagnosis not present

## 2020-05-09 DIAGNOSIS — J301 Allergic rhinitis due to pollen: Secondary | ICD-10-CM | POA: Diagnosis not present

## 2020-05-09 DIAGNOSIS — Z23 Encounter for immunization: Secondary | ICD-10-CM | POA: Diagnosis not present

## 2020-05-09 DIAGNOSIS — Z888 Allergy status to other drugs, medicaments and biological substances status: Secondary | ICD-10-CM | POA: Diagnosis not present

## 2020-05-09 DIAGNOSIS — D696 Thrombocytopenia, unspecified: Secondary | ICD-10-CM | POA: Diagnosis not present

## 2020-05-09 DIAGNOSIS — Z7982 Long term (current) use of aspirin: Secondary | ICD-10-CM | POA: Diagnosis not present

## 2020-05-11 ENCOUNTER — Other Ambulatory Visit: Payer: Self-pay

## 2020-05-11 ENCOUNTER — Ambulatory Visit (HOSPITAL_COMMUNITY)
Admission: RE | Admit: 2020-05-11 | Discharge: 2020-05-11 | Disposition: A | Discharge status: Home / Self Care | Payer: Medicare Other | Source: Ambulatory Visit | Attending: Cardiovascular Disease | Admitting: Cardiovascular Disease

## 2020-05-11 DIAGNOSIS — I872 Venous insufficiency (chronic) (peripheral): Secondary | ICD-10-CM | POA: Diagnosis not present

## 2020-05-15 ENCOUNTER — Telehealth: Payer: Self-pay | Admitting: *Deleted

## 2020-05-15 DIAGNOSIS — I872 Venous insufficiency (chronic) (peripheral): Secondary | ICD-10-CM

## 2020-05-15 NOTE — Telephone Encounter (Signed)
-----   Message from Garrel Ridgel, [place] sent at 05/11/2020 12:04 PM EDT ----- See if vascular needs to seem him based on these results.

## 2020-05-15 NOTE — Progress Notes (Signed)
Vascular consult referral sent over to [facility]

## 2020-05-19 DIAGNOSIS — E559 Vitamin D deficiency, unspecified: Secondary | ICD-10-CM | POA: Diagnosis not present

## 2020-05-19 DIAGNOSIS — Z125 Encounter for screening for malignant neoplasm of prostate: Secondary | ICD-10-CM | POA: Diagnosis not present

## 2020-05-19 DIAGNOSIS — R945 Abnormal results of liver function studies: Secondary | ICD-10-CM | POA: Diagnosis not present

## 2020-05-19 DIAGNOSIS — E7800 Pure hypercholesterolemia, unspecified: Secondary | ICD-10-CM | POA: Diagnosis not present

## 2020-05-19 DIAGNOSIS — M25562 Pain in left knee: Secondary | ICD-10-CM | POA: Diagnosis not present

## 2020-05-19 DIAGNOSIS — Z0001 Encounter for general adult medical examination with abnormal findings: Secondary | ICD-10-CM | POA: Diagnosis not present

## 2020-05-19 DIAGNOSIS — C91Z0 Other lymphoid leukemia not having achieved remission: Secondary | ICD-10-CM | POA: Diagnosis not present

## 2020-05-19 DIAGNOSIS — J301 Allergic rhinitis due to pollen: Secondary | ICD-10-CM | POA: Diagnosis not present

## 2020-05-19 DIAGNOSIS — I10 Essential (primary) hypertension: Secondary | ICD-10-CM | POA: Diagnosis not present

## 2020-05-29 DIAGNOSIS — Z23 Encounter for immunization: Secondary | ICD-10-CM | POA: Diagnosis not present

## 2020-08-02 ENCOUNTER — Other Ambulatory Visit: Payer: Self-pay | Admitting: Internal Medicine

## 2020-08-02 DIAGNOSIS — R7989 Other specified abnormal findings of blood chemistry: Secondary | ICD-10-CM

## 2020-08-08 DIAGNOSIS — R590 Localized enlarged lymph nodes: Secondary | ICD-10-CM | POA: Diagnosis not present

## 2020-08-08 DIAGNOSIS — C8300 Small cell B-cell lymphoma, unspecified site: Secondary | ICD-10-CM | POA: Diagnosis not present

## 2020-08-08 DIAGNOSIS — M7989 Other specified soft tissue disorders: Secondary | ICD-10-CM | POA: Diagnosis not present

## 2020-08-11 DIAGNOSIS — M1712 Unilateral primary osteoarthritis, left knee: Secondary | ICD-10-CM | POA: Diagnosis not present

## 2020-08-11 DIAGNOSIS — M25462 Effusion, left knee: Secondary | ICD-10-CM | POA: Diagnosis not present

## 2020-08-11 DIAGNOSIS — M25472 Effusion, left ankle: Secondary | ICD-10-CM | POA: Diagnosis not present

## 2020-08-11 DIAGNOSIS — C8300 Small cell B-cell lymphoma, unspecified site: Secondary | ICD-10-CM | POA: Diagnosis not present

## 2020-08-21 ENCOUNTER — Other Ambulatory Visit: Payer: Medicare Other

## 2020-08-30 DIAGNOSIS — S93492A Sprain of other ligament of left ankle, initial encounter: Secondary | ICD-10-CM | POA: Diagnosis not present

## 2020-08-30 DIAGNOSIS — R945 Abnormal results of liver function studies: Secondary | ICD-10-CM | POA: Diagnosis not present

## 2020-08-30 DIAGNOSIS — R609 Edema, unspecified: Secondary | ICD-10-CM | POA: Diagnosis not present

## 2020-08-30 DIAGNOSIS — M25472 Effusion, left ankle: Secondary | ICD-10-CM | POA: Diagnosis not present

## 2020-08-30 DIAGNOSIS — R188 Other ascites: Secondary | ICD-10-CM | POA: Diagnosis not present

## 2020-08-30 DIAGNOSIS — C8300 Small cell B-cell lymphoma, unspecified site: Secondary | ICD-10-CM | POA: Diagnosis not present

## 2020-08-30 DIAGNOSIS — K7689 Other specified diseases of liver: Secondary | ICD-10-CM | POA: Diagnosis not present

## 2020-08-30 DIAGNOSIS — R161 Splenomegaly, not elsewhere classified: Secondary | ICD-10-CM | POA: Diagnosis not present

## 2020-08-30 DIAGNOSIS — M65872 Other synovitis and tenosynovitis, left ankle and foot: Secondary | ICD-10-CM | POA: Diagnosis not present

## 2020-08-30 IMAGING — US US BIOPSY LYMPH NODE
1 series · 13 of 15 positions shown · non-contrast
Comparison: none

CLINICAL DATA: Bilateral cervical adenopathy, painless. No known
primary malignancy.

[Series 1: us biopsy lymph node · 13 of 15 slices shown]
[im 1/15]
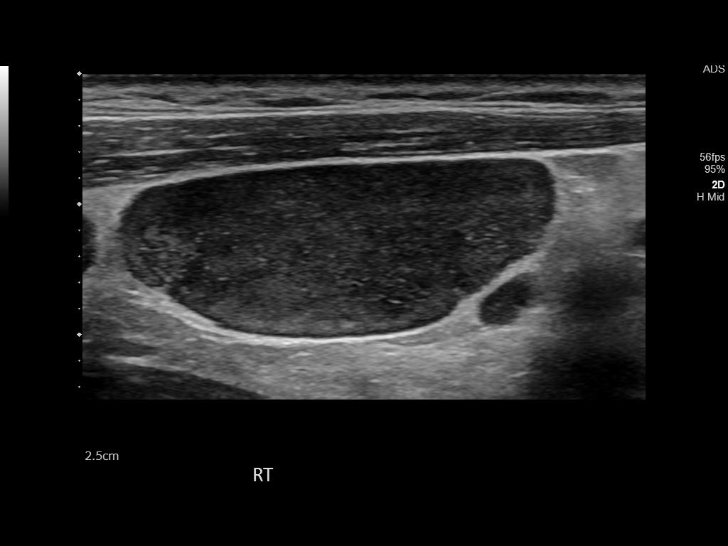
[im 2/15]
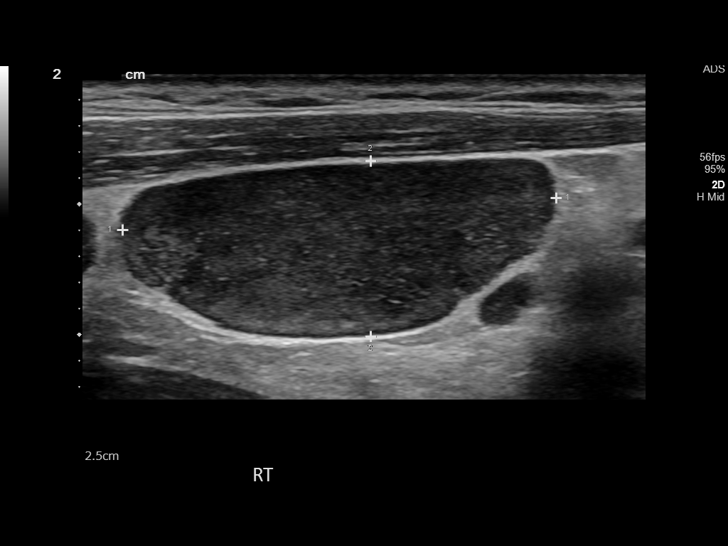
[im 3/15]
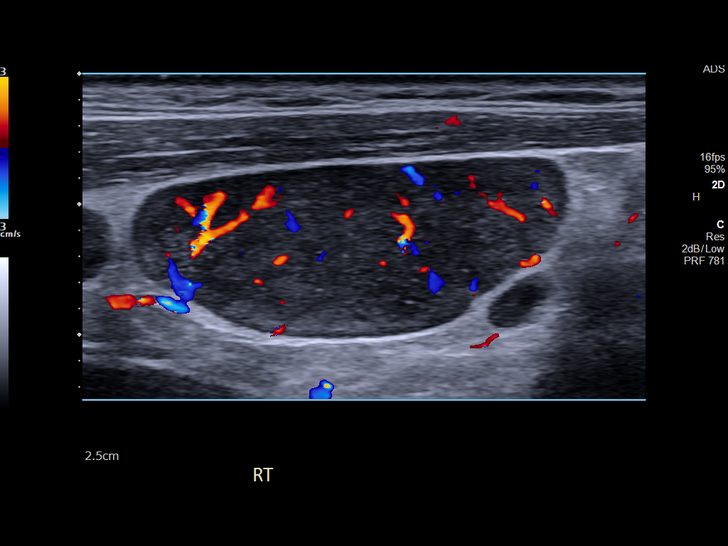
[im 5/15]
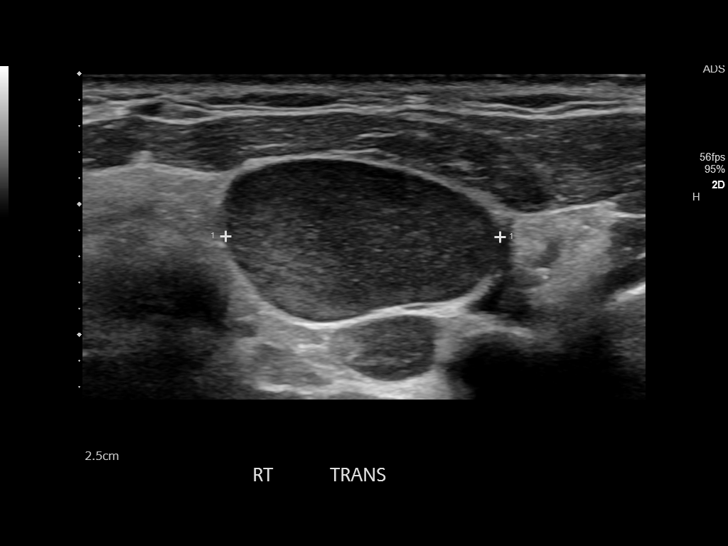
[im 6/15]
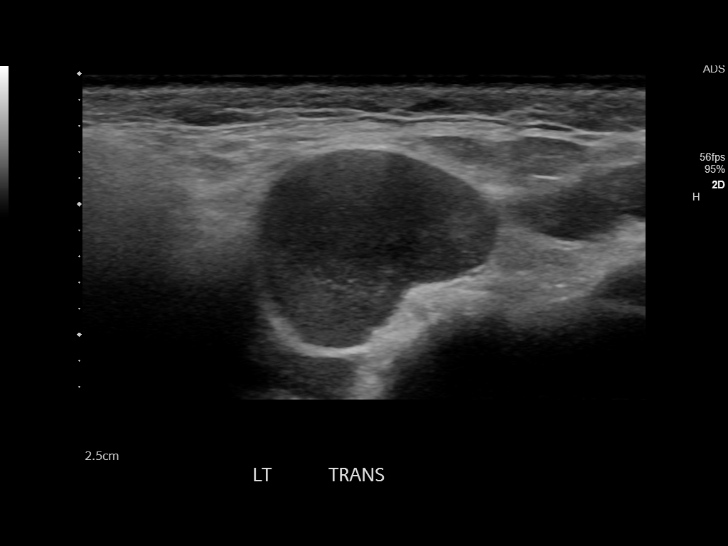
[im 7/15]
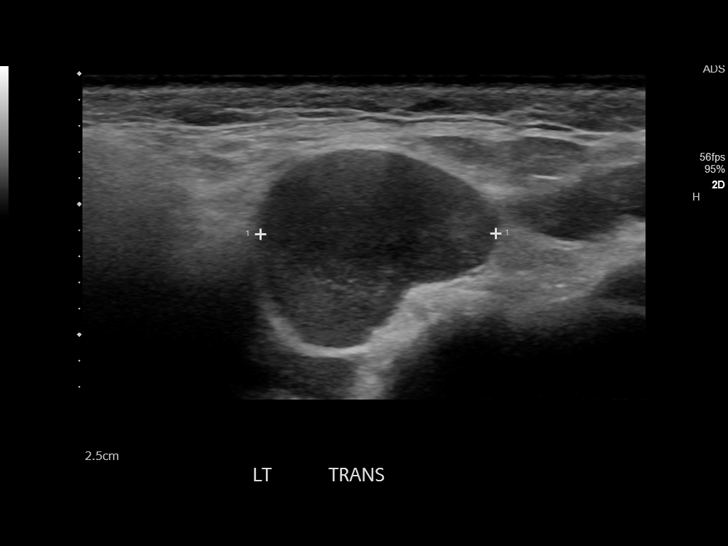
[im 8/15]
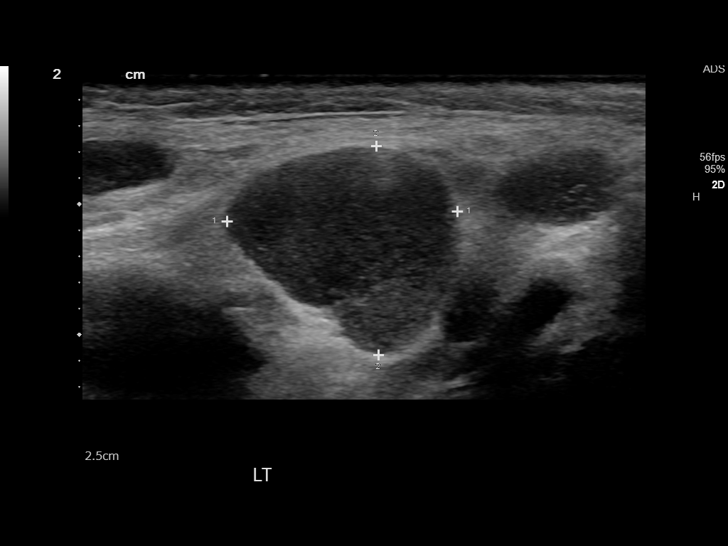
[im 9/15]
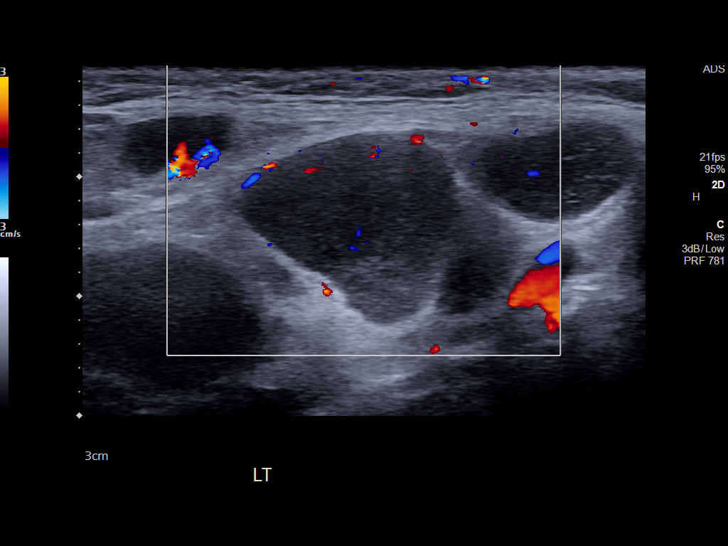
[im 10/15]
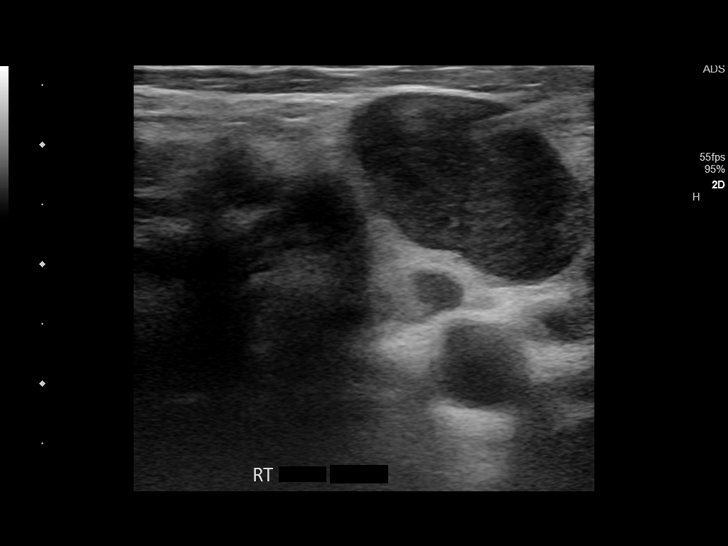
[im 11/15]
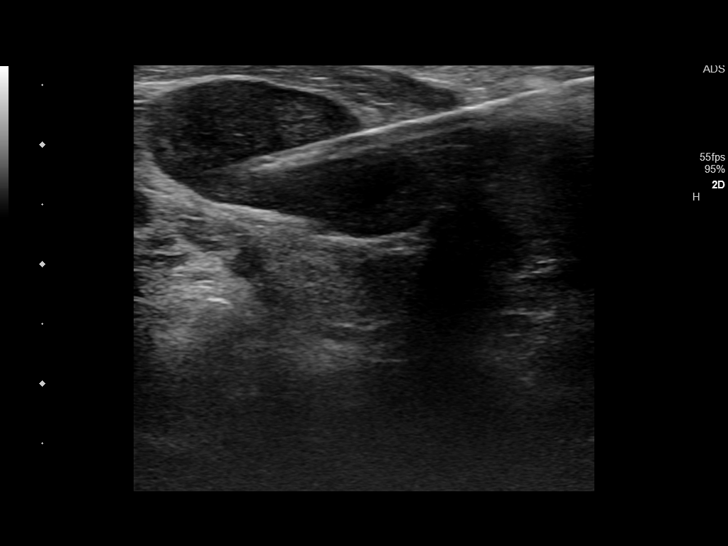
[im 13/15]
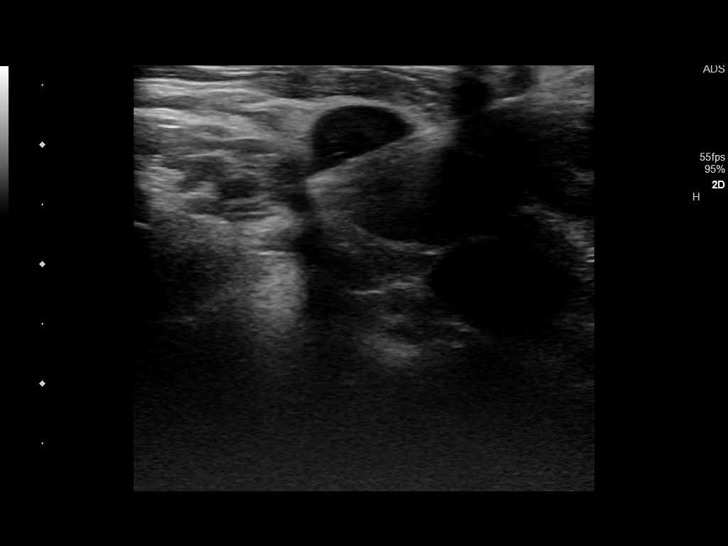
[im 14/15]
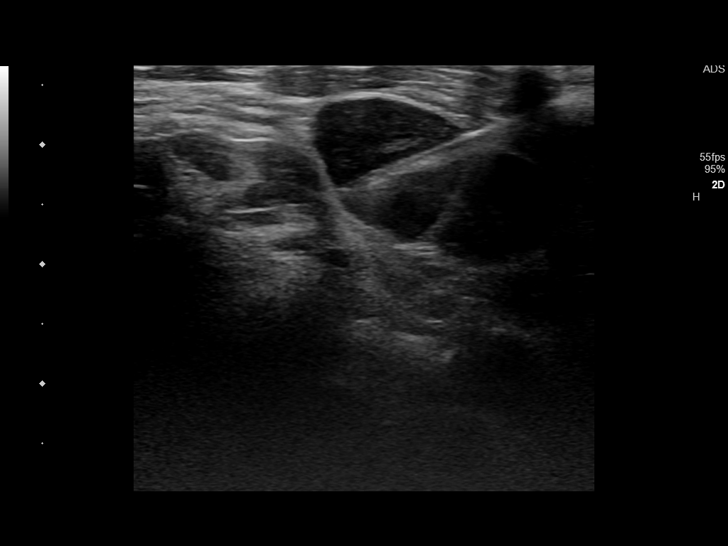
[im 15/15]
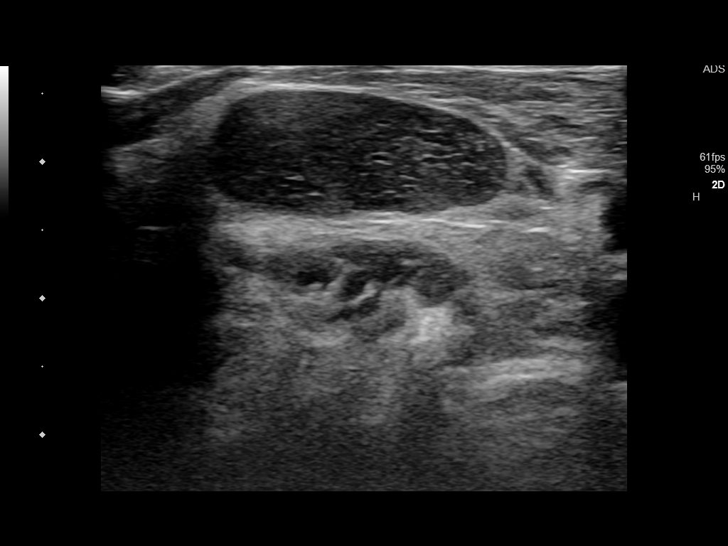

[13 of 15 positions shown; findings below may reference images not displayed]

EXAM:
ULTRASOUND GUIDED CORE BIOPSY OF RIGHT CERVICAL ADENOPATHY

MEDICATIONS:
Intravenous Fentanyl and Versed were administered as conscious
sedation during continuous monitoring of the patient's level of
consciousness and physiological / cardiorespiratory status by the
radiology RN, with a total moderate sedation time of 15 minutes.

PROCEDURE:
The procedure, risks, benefits, and alternatives were explained to
the patient. Questions regarding the procedure were encouraged and
answered. The patient understands and consents to the procedure.

Survey ultrasound of the right neck was performed. Dominant right
cervical lymph node 3.3 x 1.4 cm was again localized and an
appropriate skin entry site was determined and marked.

The operative field was prepped with chlorhexidine in a sterile
fashion, and a sterile drape was applied covering the operative
field. A sterile gown and sterile gloves were used for the
procedure. Local anesthesia was provided with 1% Lidocaine.

Under real-time ultrasound guidance, coaxial 18-gauge core biopsy
samples were obtained, submitted in saline to surgical pathology.
The guide needle was removed. Postprocedure scans show no hemorrhage
or other apparent complication. The patient tolerated the procedure
well.

COMPLICATIONS:
None.
FINDINGS: Right cervical adenopathy up to 3.3 x 1.4 cm localized.
Representative core biopsy samples obtained as above.
IMPRESSION: Technically successful ultrasound-guided core biopsy, right cervical
adenopathy.

## 2021-01-01 ENCOUNTER — Inpatient Hospital Stay: Payer: Medicare PPO

## 2021-01-01 ENCOUNTER — Emergency Department: Payer: Medicare PPO

## 2021-01-01 ENCOUNTER — Other Ambulatory Visit: Payer: Self-pay

## 2021-01-01 ENCOUNTER — Inpatient Hospital Stay
Admission: EM | Admit: 2021-01-01 | Discharge: 2021-01-05 | DRG: 432 | Disposition: A | Discharge status: Hospice | Payer: Medicare PPO | Attending: Internal Medicine | Admitting: Internal Medicine

## 2021-01-01 DIAGNOSIS — E871 Hypo-osmolality and hyponatremia: Secondary | ICD-10-CM | POA: Diagnosis present

## 2021-01-01 DIAGNOSIS — E860 Dehydration: Secondary | ICD-10-CM | POA: Diagnosis present

## 2021-01-01 DIAGNOSIS — D684 Acquired coagulation factor deficiency: Secondary | ICD-10-CM | POA: Diagnosis present

## 2021-01-01 DIAGNOSIS — Z7189 Other specified counseling: Secondary | ICD-10-CM | POA: Diagnosis not present

## 2021-01-01 DIAGNOSIS — N390 Urinary tract infection, site not specified: Secondary | ICD-10-CM | POA: Diagnosis present

## 2021-01-01 DIAGNOSIS — Z7982 Long term (current) use of aspirin: Secondary | ICD-10-CM | POA: Diagnosis not present

## 2021-01-01 DIAGNOSIS — Z823 Family history of stroke: Secondary | ICD-10-CM

## 2021-01-01 DIAGNOSIS — Z8042 Family history of malignant neoplasm of prostate: Secondary | ICD-10-CM | POA: Diagnosis not present

## 2021-01-01 DIAGNOSIS — I10 Essential (primary) hypertension: Secondary | ICD-10-CM | POA: Diagnosis present

## 2021-01-01 DIAGNOSIS — K7200 Acute and subacute hepatic failure without coma: Secondary | ICD-10-CM | POA: Diagnosis not present

## 2021-01-01 DIAGNOSIS — E43 Unspecified severe protein-calorie malnutrition: Secondary | ICD-10-CM | POA: Insufficient documentation

## 2021-01-01 DIAGNOSIS — Z87891 Personal history of nicotine dependence: Secondary | ICD-10-CM

## 2021-01-01 DIAGNOSIS — Z79899 Other long term (current) drug therapy: Secondary | ICD-10-CM | POA: Diagnosis not present

## 2021-01-01 DIAGNOSIS — R140 Abdominal distension (gaseous): Secondary | ICD-10-CM

## 2021-01-01 DIAGNOSIS — T473X6A Underdosing of saline and osmotic laxatives, initial encounter: Secondary | ICD-10-CM | POA: Diagnosis present

## 2021-01-01 DIAGNOSIS — Z515 Encounter for palliative care: Secondary | ICD-10-CM | POA: Diagnosis not present

## 2021-01-01 DIAGNOSIS — D6959 Other secondary thrombocytopenia: Secondary | ICD-10-CM | POA: Diagnosis present

## 2021-01-01 DIAGNOSIS — K7682 Hepatic encephalopathy: Secondary | ICD-10-CM

## 2021-01-01 DIAGNOSIS — Z8701 Personal history of pneumonia (recurrent): Secondary | ICD-10-CM

## 2021-01-01 DIAGNOSIS — E861 Hypovolemia: Secondary | ICD-10-CM | POA: Diagnosis present

## 2021-01-01 DIAGNOSIS — D539 Nutritional anemia, unspecified: Secondary | ICD-10-CM | POA: Diagnosis present

## 2021-01-01 DIAGNOSIS — Z808 Family history of malignant neoplasm of other organs or systems: Secondary | ICD-10-CM

## 2021-01-01 DIAGNOSIS — Z8249 Family history of ischemic heart disease and other diseases of the circulatory system: Secondary | ICD-10-CM

## 2021-01-01 DIAGNOSIS — Z91138 Patient's unintentional underdosing of medication regimen for other reason: Secondary | ICD-10-CM

## 2021-01-01 DIAGNOSIS — Z841 Family history of disorders of kidney and ureter: Secondary | ICD-10-CM

## 2021-01-01 DIAGNOSIS — K7040 Alcoholic hepatic failure without coma: Secondary | ICD-10-CM | POA: Diagnosis present

## 2021-01-01 DIAGNOSIS — F1010 Alcohol abuse, uncomplicated: Secondary | ICD-10-CM | POA: Diagnosis present

## 2021-01-01 DIAGNOSIS — K766 Portal hypertension: Secondary | ICD-10-CM | POA: Diagnosis present

## 2021-01-01 DIAGNOSIS — Z66 Do not resuscitate: Secondary | ICD-10-CM | POA: Diagnosis present

## 2021-01-01 DIAGNOSIS — N179 Acute kidney failure, unspecified: Secondary | ICD-10-CM

## 2021-01-01 DIAGNOSIS — Z8719 Personal history of other diseases of the digestive system: Secondary | ICD-10-CM

## 2021-01-01 DIAGNOSIS — C8590 Non-Hodgkin lymphoma, unspecified, unspecified site: Secondary | ICD-10-CM | POA: Diagnosis present

## 2021-01-01 DIAGNOSIS — R188 Other ascites: Secondary | ICD-10-CM | POA: Diagnosis not present

## 2021-01-01 DIAGNOSIS — K7031 Alcoholic cirrhosis of liver with ascites: Secondary | ICD-10-CM | POA: Diagnosis present

## 2021-01-01 DIAGNOSIS — Z83438 Family history of other disorder of lipoprotein metabolism and other lipidemia: Secondary | ICD-10-CM

## 2021-01-01 DIAGNOSIS — E785 Hyperlipidemia, unspecified: Secondary | ICD-10-CM | POA: Diagnosis present

## 2021-01-01 DIAGNOSIS — R339 Retention of urine, unspecified: Secondary | ICD-10-CM | POA: Diagnosis not present

## 2021-01-01 DIAGNOSIS — R17 Unspecified jaundice: Secondary | ICD-10-CM

## 2021-01-01 DIAGNOSIS — K7290 Hepatic failure, unspecified without coma: Secondary | ICD-10-CM | POA: Diagnosis present

## 2021-01-01 DIAGNOSIS — R7989 Other specified abnormal findings of blood chemistry: Secondary | ICD-10-CM

## 2021-01-01 DIAGNOSIS — R4182 Altered mental status, unspecified: Secondary | ICD-10-CM

## 2021-01-01 DIAGNOSIS — R627 Adult failure to thrive: Secondary | ICD-10-CM | POA: Diagnosis present

## 2021-01-01 DIAGNOSIS — Z20822 Contact with and (suspected) exposure to covid-19: Secondary | ICD-10-CM | POA: Diagnosis present

## 2021-01-01 DIAGNOSIS — Z884 Allergy status to anesthetic agent status: Secondary | ICD-10-CM

## 2021-01-01 LAB — CBC WITH DIFFERENTIAL/PLATELET
Abs Immature Granulocytes: 0.02 10*3/uL (ref 0.00–0.07)
Basophils Absolute: 0 10*3/uL (ref 0.0–0.1)
Basophils Relative: 1 %
Eosinophils Absolute: 0.1 10*3/uL (ref 0.0–0.5)
Eosinophils Relative: 1 %
HCT: 27.5 % — ABNORMAL LOW (ref 39.0–52.0)
Hemoglobin: 9.6 g/dL — ABNORMAL LOW (ref 13.0–17.0)
Immature Granulocytes: 0 %
Lymphocytes Relative: 66 %
Lymphs Abs: 5 10*3/uL — ABNORMAL HIGH (ref 0.7–4.0)
MCH: 36.1 pg — ABNORMAL HIGH (ref 26.0–34.0)
MCHC: 34.9 g/dL (ref 30.0–36.0)
MCV: 103.4 fL — ABNORMAL HIGH (ref 80.0–100.0)
Monocytes Absolute: 0.7 10*3/uL (ref 0.1–1.0)
Monocytes Relative: 10 %
Neutro Abs: 1.6 10*3/uL — ABNORMAL LOW (ref 1.7–7.7)
Neutrophils Relative %: 22 %
Platelets: 99 10*3/uL — ABNORMAL LOW (ref 150–400)
RBC: 2.66 MIL/uL — ABNORMAL LOW (ref 4.22–5.81)
RDW: 18.4 % — ABNORMAL HIGH (ref 11.5–15.5)
Smear Review: NORMAL
WBC: 7.4 10*3/uL (ref 4.0–10.5)
nRBC: 0 % (ref 0.0–0.2)

## 2021-01-01 LAB — BLOOD GAS, VENOUS
Acid-base deficit: 0.6 mmol/L (ref 0.0–2.0)
Bicarbonate: 22.8 mmol/L (ref 20.0–28.0)
O2 Saturation: 78.4 %
Patient temperature: 37
pCO2, Ven: 32 mmHg — ABNORMAL LOW (ref 44.0–60.0)
pH, Ven: 7.46 — ABNORMAL HIGH (ref 7.250–7.430)
pO2, Ven: 40 mmHg (ref 32.0–45.0)

## 2021-01-01 LAB — PROTIME-INR
INR: 1.7 — ABNORMAL HIGH (ref 0.8–1.2)
Prothrombin Time: 20 seconds — ABNORMAL HIGH (ref 11.4–15.2)

## 2021-01-01 LAB — URINALYSIS, COMPLETE (UACMP) WITH MICROSCOPIC
Bilirubin Urine: NEGATIVE
Glucose, UA: NEGATIVE mg/dL
Ketones, ur: NEGATIVE mg/dL
Leukocytes,Ua: NEGATIVE
Nitrite: NEGATIVE
Protein, ur: 100 mg/dL — AB
RBC / HPF: >50 RBC/hpf — ABNORMAL HIGH (ref 0–5)
Specific Gravity, Urine: 1.021 (ref 1.005–1.030)
WBC, UA: >50 WBC/hpf — ABNORMAL HIGH (ref 0–5)
pH: 5 (ref 5.0–8.0)

## 2021-01-01 LAB — COMPREHENSIVE METABOLIC PANEL
ALT: 38 U/L (ref 0–44)
AST: 51 U/L — ABNORMAL HIGH (ref 15–41)
Albumin: 3.5 g/dL (ref 3.5–5.0)
Alkaline Phosphatase: 101 U/L (ref 38–126)
Anion gap: 10 (ref 5–15)
BUN: 52 mg/dL — ABNORMAL HIGH (ref 8–23)
CO2: 21 mmol/L — ABNORMAL LOW (ref 22–32)
Calcium: 9.1 mg/dL (ref 8.9–10.3)
Chloride: 97 mmol/L — ABNORMAL LOW (ref 98–111)
Creatinine, Ser: 1.83 mg/dL — ABNORMAL HIGH (ref 0.61–1.24)
GFR, Estimated: 39 mL/min — ABNORMAL LOW (ref >60)
Glucose, Bld: 134 mg/dL — ABNORMAL HIGH (ref 70–99)
Potassium: 5 mmol/L (ref 3.5–5.1)
Sodium: 128 mmol/L — ABNORMAL LOW (ref 135–145)
Total Bilirubin: 5.5 mg/dL — ABNORMAL HIGH (ref 0.3–1.2)
Total Protein: 6.2 g/dL — ABNORMAL LOW (ref 6.5–8.1)

## 2021-01-01 LAB — AMMONIA: Ammonia: 82 umol/L — ABNORMAL HIGH (ref 9–35)

## 2021-01-01 LAB — RESP PANEL BY RT-PCR (FLU A&B, COVID) ARPGX2
Influenza A by PCR: NEGATIVE
Influenza B by PCR: NEGATIVE
SARS Coronavirus 2 by RT PCR: NEGATIVE

## 2021-01-01 LAB — MAGNESIUM: Magnesium: 2.5 mg/dL — ABNORMAL HIGH (ref 1.7–2.4)

## 2021-01-01 MED ORDER — SODIUM CHLORIDE 0.9 % IV SOLN
1.0000 g | INTRAVENOUS | Status: DC
  Administered 2021-01-02: 1 g via INTRAVENOUS
  Filled 2021-01-01 (×2): qty 10

## 2021-01-01 MED ORDER — CLONIDINE HCL 0.1 MG PO TABS
0.2000 mg | ORAL_TABLET | Freq: Two times a day (BID) | ORAL | Status: DC
  Administered 2021-01-01 – 2021-01-03 (×3): 0.2 mg via ORAL
  Filled 2021-01-01 (×4): qty 2

## 2021-01-01 MED ORDER — LACTULOSE 10 GM/15ML PO SOLN
10.0000 g | Freq: Once | ORAL | Status: AC
  Administered 2021-01-01: 10 g via ORAL
  Filled 2021-01-01: qty 30

## 2021-01-01 MED ORDER — LACTULOSE 10 GM/15ML PO SOLN
30.0000 g | Freq: Two times a day (BID) | ORAL | Status: DC
  Administered 2021-01-01 – 2021-01-03 (×5): 30 g via ORAL
  Filled 2021-01-01 (×5): qty 60

## 2021-01-01 MED ORDER — SULFAMETHOXAZOLE-TRIMETHOPRIM 800-160 MG PO TABS
1.0000 | ORAL_TABLET | Freq: Every day | ORAL | Status: DC
  Administered 2021-01-02: 1 via ORAL
  Filled 2021-01-01 (×2): qty 1

## 2021-01-01 MED ORDER — LACTATED RINGERS IV SOLN: INTRAVENOUS | Status: DC

## 2021-01-01 MED ORDER — ONDANSETRON HCL 4 MG/2ML IJ SOLN: 4.0000 mg | Freq: Four times a day (QID) | INTRAMUSCULAR | Status: DC | PRN

## 2021-01-01 MED ORDER — METOPROLOL TARTRATE 5 MG/5ML IV SOLN: 2.5000 mg | INTRAVENOUS | Status: DC | PRN

## 2021-01-01 NOTE — H&P (Signed)
History and Physical  Cameron Miles KGU:[id number] DOB: 04/22/1951 DOA: 01/01/2021  Referring physician: Dr. Cheri Fowler, [place] PCP: Rogers Blocker, MD  Outpatient Specialists: GI, oncology. Patient coming from: Home, lives with his wife.  He has no children.  Chief Complaint: Confusion for the past 2 days, poor oral intake, generalized weakness.  HPI: Cameron Miles is a 70 y.o. male with medical history significant for untreated lymphoma, essential hypertension, newly diagnosed alcoholic cirrhosis in January 2022, former alcohol use disorder, recently diagnosed SBP on Bactrim from recent hospitalization at [facility] (from 12/02/2020 until 12/09/2020), had 3 paracentesis during that admission, who presented to [facility] ED due to confusion in the past 2 days.  Associated with generalized weakness, poor oral intake, noncompliance with his lactulose at home.  According to his wife he does not always take his lactulose, gets tired of having frequent loose stools.  No reported fevers at home however he stays cold.  No nausea or vomiting.  No melena or hematochezia.  Presented to the ED for further evaluation and management.  Work-up in the ED revealed acute hepatitic encephalopathy with ammonia level 82.  Patient received lactulose in the ED.  EDP requested admission.  ED Course:  Afebrile.  BP 148/76, pulse 103, respiratory rate 16, O2 saturation 97% on room air.  Lab studies remarkable for serum sodium 128, potassium 5.0, serum bicarb 21, glucose 134, BUN 52, creatinine 1.83, anion gap 10, magnesium 2.5, AST 51, AST 38, ammonia 82, albumin 3.5, WBC 7.4, hemoglobin 9.6, MCV 103, platelet 99.  UA positive for pyuria.  Review of Systems: Review of systems as noted in the HPI. All other systems reviewed and are negative.   Past Medical History:  Diagnosis Date  . Allergy   . Chicken pox   . Hyperlipidemia   . Hypertension   . Kidney stones   . Lymphoma ([place]) 05/2018   Past Surgical History:  Procedure  Laterality Date  . LITHOTRIPSY  1985  . LYMPH NODE BIOPSY      Social History:  reports that he quit smoking about 51 years ago. He has never used smokeless tobacco. He reports current alcohol use of about 7.0 standard drinks of alcohol per week. He reports that he does not use drugs.   Allergies  Allergen Reactions  . Other Nausea And Vomiting    Anesthesia unknown class    Family History  Problem Relation Age of Onset  . Stroke Mother   . Prostate cancer Brother   . Hyperlipidemia Brother   . Heart disease Brother   . Stroke Brother   . Kidney disease Brother   . Mental illness Brother   . Melanoma Brother   . Bladder Cancer Neg Hx   . Kidney cancer Neg Hx       Prior to Admission medications   Medication Sig Start Date End Date Taking? Authorizing Provider  aspirin EC 81 MG tablet Take 81 mg by mouth daily.    [provider]  cloNIDine (CATAPRES) 0.2 MG tablet Take 0.2 mg by mouth 2 (two) times daily. 02/10/20   [provider]  diphenhydrAMINE (BENADRYL) 25 mg capsule Take by mouth.    [provider]  ergocalciferol (VITAMIN D2) 1.25 MG (50000 UT) capsule Take by mouth.    [provider]  Loratadine (CLARITIN) 10 MG CAPS Take by mouth as needed.     [provider]    Physical Exam: BP (!) 148/76 (BP Location: Right Arm)  Pulse (!) 103   Temp (!) 97.5 F (36.4 C) (Oral)   Resp 16   Ht 6\' 2"  (1.88 m)   Wt 76.6 kg   SpO2 97%   BMI 21.68 kg/m   . General: 70 y.o. year-old male well developed well nourished in no acute distress.  Alert and confused, agitated pulling at his leads. . Cardiovascular: Tachycardic with no rubs or gallops.  No thyromegaly or JVD noted.  No lower extremity edema. 2/4 pulses in all 4 extremities. Marland Kitchen Respiratory: Clear to auscultation with no wheezes or rales. Good inspiratory effort. . Abdomen: Soft nontender nondistended with normal bowel sounds x4 quadrants. . Muskuloskeletal: No cyanosis,  clubbing or edema noted bilaterally . Neuro: CN II-XII intact, strength, sensation, reflexes . Skin: No ulcerative lesions noted or rashes . Psychiatry: Judgement and insight appear altered. Mood is appropriate for condition and setting          Labs on Admission:  Basic Metabolic Panel: Recent Labs  Lab 01/01/21 1452  NA 128*  K 5.0  CL 97*  CO2 21*  GLUCOSE 134*  BUN 52*  CREATININE 1.83*  CALCIUM 9.1  MG 2.5*   Liver Function Tests: Recent Labs  Lab 01/01/21 1452  AST 51*  ALT 38  ALKPHOS 101  BILITOT 5.5*  PROT 6.2*  ALBUMIN 3.5   No results for input(s): LIPASE, AMYLASE in the last 168 hours. Recent Labs  Lab 01/01/21 1452  AMMONIA 82*   CBC: Recent Labs  Lab 01/01/21 1452  WBC 7.4  NEUTROABS 1.6*  HGB 9.6*  HCT 27.5*  MCV 103.4*  PLT 99*   Cardiac Enzymes: No results for input(s): CKTOTAL, CKMB, CKMBINDEX, TROPONINI in the last 168 hours.  BNP (last 3 results) No results for input(s): BNP in the last 8760 hours.  ProBNP (last 3 results) No results for input(s): PROBNP in the last 8760 hours.  CBG: No results for input(s): GLUCAP in the last 168 hours.  Radiological Exams on Admission: CT Head Wo Contrast  Result Date: 01/01/2021 CLINICAL DATA:  Mental status change. Weakness. Abdominal swelling. Cirrhosis. No reported injury. EXAM: CT HEAD WITHOUT CONTRAST TECHNIQUE: Contiguous axial images were obtained from the base of the skull through the vertex without intravenous contrast. COMPARISON:  None. FINDINGS: Brain: Nonspecific moderate subcortical and periventricular white matter hypodensity, most in keeping with chronic small vessel ischemic change. No evidence of parenchymal hemorrhage or extra-axial fluid collection. No mass lesion, mass effect, or midline shift. No CT evidence of acute infarction. Cerebral volume is age appropriate. No ventriculomegaly. Vascular: No acute abnormality. Skull: No evidence of calvarial fracture. Sinuses/Orbits:  The visualized paranasal sinuses are essentially clear. Other:  The mastoid air cells are unopacified. IMPRESSION: 1. No evidence of acute intracranial abnormality. 2. Moderate chronic small vessel ischemic changes in the cerebral white matter. Electronically Signed   By: Ilona Sorrel M.D.   On: 01/01/2021 16:57   US Abdomen Complete  Result Date: 01/01/2021 CLINICAL DATA:  Jaundice, acute kidney injury EXAM: ABDOMEN ULTRASOUND COMPLETE COMPARISON:  11/26/2017 abdominal sonogram FINDINGS: Gallbladder: No gallstones or wall thickening visualized. No sonographic Murphy sign noted by sonographer. Common bile duct: Diameter: 4 mm Liver: Diffusely nodular liver contour with coarsened liver parenchymal echotexture, compatible with cirrhosis. No liver masses detected. Portal vein is patent on color Doppler imaging with normal direction of blood flow towards the liver. IVC: No abnormality visualized. Pancreas: Visualized portion unremarkable. Spleen: Size and appearance within normal limits. Right Kidney: Length: 10.4 cm. Echogenicity  within normal limits. No mass or hydronephrosis visualized. Left Kidney: Length: 10.1 cm. Echogenicity within normal limits. No mass or hydronephrosis visualized. Abdominal aorta: Not visualized due to overlying bowel gas. Other findings: Moderate volume ascites, most prominent in the perihepatic and right abdomen. IMPRESSION: 1. Morphologic changes of cirrhosis.  No liver masses detected. 2. Moderate volume ascites, most prominent in the right abdomen. Electronically Signed   By: Ilona Sorrel M.D.   On: 01/01/2021 19:14    EKG: I independently viewed the EKG done and my findings are as followed: Sinus tachycardia rate of 102, nonspecific ST-T changes.  QTc 489.  Assessment/Plan Present on Admission: . Acute hepatic encephalopathy  Active Problems:   Acute hepatic encephalopathy  Acute hepatic encephalopathy likely secondary to noncompliance with lactulose Per his wife at  bedside he does not always take his lactulose because he gets tired of having frequent loose stools. History of alcoholic cirrhosis Presented with ammonia level 82 and confusion CT head nonacute Resume lactulose Goal bowel movement 2 to 3/day. Fall precautions, aspiration precautions, delirium precautions. Meld score 29 associated with 19.6% estimated 51-month mortality Palliative consult One-to-one sitter in place for patient's own safety as the patient has been pulling at his leads and IV access.  Presumptive UTI, POA Likely contributing to his encephalopathy Presented with UA positive for pyuria Started Rocephin empirically Follow cultures.  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  Palliative consult  AKI likely prerenal in the setting of poor oral intake with dehydration Also possible hepatorenal Hold off diuretics Start gentle IV fluid hydration LR 30 cc/h x 2 days. Not hypotensive at the time of this visit. If becomes hypotensive switch IV fluid to IV albumin Follow abdominal ultrasound Avoid nephrotoxic agents Monitor urine output Repeat renal panel in the morning  Macrocytic anemia No recent data to compare Presented with hemoglobin 9.6 with MCV of 103 Last hemoglobin 17 from medical record in 2019 with MCV of 90. Obtain FOBT Obtain iron studies GI consult  Abdominal ascites from liver cirrhosis Obtain abdominal ultrasound May need paracentesis during this admission.  Hypovolemic hyponatremia Presented with serum sodium 128 On LR IV fluid at 30 cc/h x 2 days Encourage oral intake if able No reported nausea or vomiting Repeat renal panel in the morning  Elevated AST  Alkaline phosphatase, ALT normal Monitor for now Last alcohol intake per his wife was after he was diagnosed with  alcoholic cirrhosis around January or February 2022.  Chronic thrombocytopenia in the setting of ESLD Platelet count 99 No reported overt bleeding Continue to monitor H&H  Coagulopathy INR 1.7 Monitor repeat INR in the morning Hold off home aspirin for now.  Hyperbilirubinemia Presented with T bili of 5.5 Follow abdominal ultrasound Repeat CMP in the morning  Essential hypertension BP is not at goal, elevated. Resume home clonidine Continue to monitor vital signs IV antihypertensives as needed with parameters  History of SBP Per his wife at bedside patient was recently admitted at [facility] and was diagnosed with spontaneous bacterial peritonitis, was discharged on Bactrim Resume home Bactrim  History of untreated lymphoma Will need to follow-up with his oncologist outpatient.  Generalized weakness PT OT to assess Dietitian consult Fall precautions TOC consulted for possible SNF placement     DVT prophylaxis: SCDs.  Code Status:  Full code as stated by the patient's wife at bedside  Family Communication: Plan of care discussed with the patient's wife at bedside.  She understands and agrees with the plan.  Disposition Plan: Admit to MedSurg unit with remote telemetry.  Consults called: GI.  Admission status: Inpatient status.  Patient will require at least 2 midnights for further evaluation and treatment of present condition.   Status is: Inpatient    Dispo:  Patient From: Home  Planned Disposition: Home, possibly SNF on 01/03/2021 when acute hepatic encephalopathy has resolved.  Medically stable for discharge: No, ongoing management of acute hepatic encephalopathy.         Kayleen Memos MD [facility] Pager [phone]  If 7PM-7AM, please contact night-coverage [email or web address] Password [facility]  01/01/2021, 10:18 PM

## 2021-01-01 NOTE — ED Notes (Signed)
Patient wanting to get out of bed and pull IV out. Family is in room at this point. I have informed RN Tammy of the pt and she asked me to place mittens on patient. Mittens have been placed and pt seems to have calm down, and is watching TV at this time.

## 2021-01-01 NOTE — ED Provider Notes (Signed)
[facility] Emergency Department Provider Note   ____________________________________________   Event Date/Time   First MD Initiated Contact with Patient 01/01/21 1503     (approximate)  I have reviewed the triage vital signs and the nursing notes.   HISTORY  Chief Complaint Altered Mental Status, Weakness, and Abdominal Pain    HPI Cameron Miles is a 70 y.o. male with the below past medical history who presents for altered mental status.  Patient is unable to provide complete history however he does deny any pain at this time.  Patient is oriented only to self and further history and review of systems are unable to be obtained at this time         Past Medical History:  Diagnosis Date  . Allergy   . Chicken pox   . Hyperlipidemia   . Hypertension   . Kidney stones   . Lymphoma ([place]) 05/2018    Patient Active Problem List   Diagnosis Date Noted  . Acute hepatic encephalopathy 01/01/2021  . 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 Transaminitis 11/02/2019  . 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 Cough 10/26/2018  . Malignant lymphoma, low grade ([place]) 05/13/2018  . Sleeping difficulty 04/01/2018  . Cervical lymphadenopathy 04/01/2018  . Allergic rhinitis 10/02/2017  . Elevated LFTs 10/02/2017  . Ear fullness, right 06/30/2017  . Family history of prostate cancer 04/18/2017  . Phimosis 04/18/2017  . Erectile dysfunction due to arterial insufficiency 04/18/2017  . Enlarged prostate on rectal examination 04/18/2017  . Hypertension 03/27/2017  . Effusion of left olecranon bursa 03/27/2017  . Vision changes 03/27/2017  . Pure hypercholesterolemia 11/16/2015    Past Surgical History:  Procedure Laterality Date  . LITHOTRIPSY  1985  . LYMPH NODE BIOPSY      Prior to Admission medications   Medication Sig Start Date End Date Taking? Authorizing Provider  aspirin EC 81 MG tablet Take 81 mg by mouth daily.    [provider]  cloNIDine (CATAPRES) 0.2 MG tablet Take 0.2 mg by mouth 2 (two) times daily. 02/10/20   [provider]  diphenhydrAMINE (BENADRYL) 25 mg capsule Take by mouth.    [provider]  ergocalciferol (VITAMIN D2) 1.25 MG (50000 UT) capsule Take by mouth.    [provider]  Loratadine (CLARITIN) 10 MG CAPS Take by mouth as needed.     [provider]    Allergies Other  Family History  Problem Relation Age of Onset  . Stroke Mother   . Prostate cancer Brother   . Hyperlipidemia Brother   . Heart disease Brother   . Stroke Brother   . Kidney disease Brother   . Mental illness Brother   . Melanoma Brother   . Bladder Cancer Neg Hx   . Kidney cancer Neg Hx     Social History Social History   Tobacco Use  . Smoking status: Former Smoker    Quit date: 08/08/1969    Years since quitting: 51.4  . Smokeless tobacco: Never Used  Vaping Use  . Vaping Use: Never used  Substance Use Topics  . Alcohol use: Yes    Alcohol/week: 7.0 standard drinks    Types: 7 Standard drinks or equivalent per week  . Drug use: No    Review of Systems Unable to assess secondary to mental status ____________________________________________   PHYSICAL EXAM:  VITAL SIGNS: ED Triage Vitals  Enc Vitals Group     BP 01/01/21 1448 135/73  Pulse Rate 01/01/21 1448 100     Resp 01/01/21 1448 20     Temp 01/01/21 1448 97.7 F (36.5 C)     Temp Source 01/01/21 1448 Oral     SpO2 01/01/21 1448 98 %     Weight 01/01/21 1449 168 lb 14 oz (76.6 kg)     Height 01/01/21 1449 6\' 2"  (1.88 m)     Head Circumference --      Peak Flow --      Pain Score 01/01/21 1449 0     Pain Loc --      Pain Edu? --      Excl. in [place]? --    Constitutional: Alert and oriented. Well appearing and in no acute distress. Eyes: Conjunctivae are icteric. PERRL. Head: Atraumatic. Nose: No congestion/rhinnorhea. Mouth/Throat: Mucous membranes are moist. Neck: No  stridor Cardiovascular: Grossly normal heart sounds.  Good peripheral circulation. Respiratory: Normal respiratory effort.  No retractions. Gastrointestinal: Soft and nontender.  Minimal distention. Musculoskeletal: No obvious deformities Neurologic: Slurred speech and slow responses.  Moving all extremities spontaneously Skin:  Skin is warm and dry. No rash noted.  Jaundiced Psychiatric: Mood and affect are normal. Speech and behavior are normal.  ____________________________________________   LABS (all labs ordered are listed, but only abnormal results are displayed)  Labs Reviewed  CBC WITH DIFFERENTIAL/PLATELET - Abnormal; Notable for the following components:      Result Value   RBC 2.66 (*)    Hemoglobin 9.6 (*)    HCT 27.5 (*)    MCV 103.4 (*)    MCH 36.1 (*)    RDW 18.4 (*)    Platelets 99 (*)    Neutro Abs 1.6 (*)    Lymphs Abs 5.0 (*)    All other components within normal limits  COMPREHENSIVE METABOLIC PANEL - Abnormal; Notable for the following components:   Sodium 128 (*)    Chloride 97 (*)    CO2 21 (*)    Glucose, Bld 134 (*)    BUN 52 (*)    Creatinine, Ser 1.83 (*)    Total Protein 6.2 (*)    AST 51 (*)    Total Bilirubin 5.5 (*)    GFR, Estimated 39 (*)    All other components within normal limits  MAGNESIUM - Abnormal; Notable for the following components:   Magnesium 2.5 (*)    All other components within normal limits  URINALYSIS, COMPLETE (UACMP) WITH MICROSCOPIC - Abnormal; Notable for the following components:   Color, Urine AMBER (*)    APPearance CLOUDY (*)    Hgb urine dipstick LARGE (*)    Protein, ur 100 (*)    RBC / HPF >50 (*)    WBC, UA >50 (*)    Bacteria, UA RARE (*)    All other components within normal limits  AMMONIA - Abnormal; Notable for the following components:   Ammonia 82 (*)    All other components within normal limits  BLOOD GAS, VENOUS - Abnormal; Notable for the following components:   pH, Ven 7.46 (*)    pCO2,  Ven 32 (*)    All other components within normal limits  PROTIME-INR - Abnormal; Notable for the following components:   Prothrombin Time 20.0 (*)    INR 1.7 (*)    All other components within normal limits  RESP PANEL BY RT-PCR (FLU A&B, COVID) ARPGX2  URINE CULTURE  OCCULT BLOOD X 1 CARD TO LAB, STOOL   ____________________________________________  EKG  ED ECG REPORT I, Naaman Plummer, the attending physician, personally viewed and interpreted this ECG.  Date: 01/01/2021 EKG Time: 1505 Rate: 102 Rhythm: Tachycardic sinus rhythm QRS Axis: normal Intervals: normal ST/T Wave abnormalities: normal Narrative Interpretation: no evidence of acute ischemia  ____________________________________________  RADIOLOGY  ED MD interpretation: CT of the head without contrast shows no evidence of acute abnormalities  Official radiology report(s): CT Head Wo Contrast  Result Date: 01/01/2021 CLINICAL DATA:  Mental status change. Weakness. Abdominal swelling. Cirrhosis. No reported injury. EXAM: CT HEAD WITHOUT CONTRAST TECHNIQUE: Contiguous axial images were obtained from the base of the skull through the vertex without intravenous contrast. COMPARISON:  None. FINDINGS: Brain: Nonspecific moderate subcortical and periventricular white matter hypodensity, most in keeping with chronic small vessel ischemic change. No evidence of parenchymal hemorrhage or extra-axial fluid collection. No mass lesion, mass effect, or midline shift. No CT evidence of acute infarction. Cerebral volume is age appropriate. No ventriculomegaly. Vascular: No acute abnormality. Skull: No evidence of calvarial fracture. Sinuses/Orbits: The visualized paranasal sinuses are essentially clear. Other:  The mastoid air cells are unopacified. IMPRESSION: 1. No evidence of acute intracranial abnormality. 2. Moderate chronic small vessel ischemic changes in the cerebral white matter. Electronically Signed   By: Ilona Sorrel M.D.   On:  01/01/2021 16:57   US Abdomen Complete  Result Date: 01/01/2021 CLINICAL DATA:  Jaundice, acute kidney injury EXAM: ABDOMEN ULTRASOUND COMPLETE COMPARISON:  11/26/2017 abdominal sonogram FINDINGS: Gallbladder: No gallstones or wall thickening visualized. No sonographic Murphy sign noted by sonographer. Common bile duct: Diameter: 4 mm Liver: Diffusely nodular liver contour with coarsened liver parenchymal echotexture, compatible with cirrhosis. No liver masses detected. Portal vein is patent on color Doppler imaging with normal direction of blood flow towards the liver. IVC: No abnormality visualized. Pancreas: Visualized portion unremarkable. Spleen: Size and appearance within normal limits. Right Kidney: Length: 10.4 cm. Echogenicity within normal limits. No mass or hydronephrosis visualized. Left Kidney: Length: 10.1 cm. Echogenicity within normal limits. No mass or hydronephrosis visualized. Abdominal aorta: Not visualized due to overlying bowel gas. Other findings: Moderate volume ascites, most prominent in the perihepatic and right abdomen. IMPRESSION: 1. Morphologic changes of cirrhosis.  No liver masses detected. 2. Moderate volume ascites, most prominent in the right abdomen. Electronically Signed   By: Ilona Sorrel M.D.   On: 01/01/2021 19:14    ____________________________________________   PROCEDURES  Procedure(s) performed (including Critical Care):  .1-3 Lead EKG Interpretation Performed by: Naaman Plummer, MD Authorized by: Naaman Plummer, MD     Interpretation: normal     ECG rate:  87   ECG rate assessment: normal     Rhythm: sinus rhythm     Ectopy: none     Conduction: normal       ____________________________________________   INITIAL IMPRESSION / ASSESSMENT AND PLAN / ED COURSE  As part of my medical decision making, I reviewed the following data within the [facility] notes reviewed and incorporated, Labs reviewed, EKG interpreted, Old  chart reviewed, Radiograph reviewed and Notes from prior ED visits reviewed and incorporated      Given history of cirrhosis, exam, and workup the patients disorientation is most concerning for hepatic encephalopathy. At this time I have a lower suspicion for ICH, CNS infection, severe Toxidrome, severe metabolic derangement, or stroke. ED Interventions: Lactulose 20g PO Disposition: Admit      ____________________________________________   FINAL CLINICAL IMPRESSION(S) / ED DIAGNOSES  Final  diagnoses:  AKI (acute kidney injury) ([place])  Altered mental status, unspecified altered mental status type  Hepatic encephalopathy [facility])     ED Discharge Orders    None       Note:  This document was prepared using Dragon voice recognition software and may include unintentional dictation errors.   Naaman Plummer, MD 01/01/21 2005

## 2021-01-01 NOTE — ED Provider Notes (Signed)
MSE was initiated and I personally evaluated the patient and placed orders (if any) at  2:49 PM on Jan 01, 2021.  The patient appears stable so that the remainder of the MSE may be completed by another provider.   Vladimir Crofts, MD 01/01/21 [phone]

## 2021-01-01 NOTE — ED Triage Notes (Signed)
Pt brought in via EMS from home for AMS, weakness and abdominal swelling progressively worsening x 2 days.  Family reports poor PO intake.  HX of cirrhosis.  Last paracentesis was 1 week ago, 9 liters pulled.  Baseline mild confusion.

## 2021-01-02 ENCOUNTER — Inpatient Hospital Stay: Payer: Medicare PPO

## 2021-01-02 DIAGNOSIS — K7031 Alcoholic cirrhosis of liver with ascites: Secondary | ICD-10-CM | POA: Diagnosis not present

## 2021-01-02 DIAGNOSIS — N179 Acute kidney failure, unspecified: Secondary | ICD-10-CM

## 2021-01-02 DIAGNOSIS — K7200 Acute and subacute hepatic failure without coma: Secondary | ICD-10-CM | POA: Diagnosis not present

## 2021-01-02 DIAGNOSIS — E43 Unspecified severe protein-calorie malnutrition: Secondary | ICD-10-CM | POA: Insufficient documentation

## 2021-01-02 DIAGNOSIS — R17 Unspecified jaundice: Secondary | ICD-10-CM

## 2021-01-02 LAB — COMPREHENSIVE METABOLIC PANEL
ALT: 36 U/L (ref 0–44)
AST: 53 U/L — ABNORMAL HIGH (ref 15–41)
Albumin: 3 g/dL — ABNORMAL LOW (ref 3.5–5.0)
Alkaline Phosphatase: 87 U/L (ref 38–126)
Anion gap: 11 (ref 5–15)
BUN: 51 mg/dL — ABNORMAL HIGH (ref 8–23)
CO2: 19 mmol/L — ABNORMAL LOW (ref 22–32)
Calcium: 8.8 mg/dL — ABNORMAL LOW (ref 8.9–10.3)
Chloride: 99 mmol/L (ref 98–111)
Creatinine, Ser: 1.45 mg/dL — ABNORMAL HIGH (ref 0.61–1.24)
GFR, Estimated: 52 mL/min — ABNORMAL LOW (ref >60)
Glucose, Bld: 114 mg/dL — ABNORMAL HIGH (ref 70–99)
Potassium: 5.4 mmol/L — ABNORMAL HIGH (ref 3.5–5.1)
Sodium: 129 mmol/L — ABNORMAL LOW (ref 135–145)
Total Bilirubin: 6.2 mg/dL — ABNORMAL HIGH (ref 0.3–1.2)
Total Protein: 5.6 g/dL — ABNORMAL LOW (ref 6.5–8.1)

## 2021-01-02 LAB — CBC
HCT: 27.4 % — ABNORMAL LOW (ref 39.0–52.0)
Hemoglobin: 9.8 g/dL — ABNORMAL LOW (ref 13.0–17.0)
MCH: 37.1 pg — ABNORMAL HIGH (ref 26.0–34.0)
MCHC: 35.8 g/dL (ref 30.0–36.0)
MCV: 103.8 fL — ABNORMAL HIGH (ref 80.0–100.0)
Platelets: 78 10*3/uL — ABNORMAL LOW (ref 150–400)
RBC: 2.64 MIL/uL — ABNORMAL LOW (ref 4.22–5.81)
RDW: 18.6 % — ABNORMAL HIGH (ref 11.5–15.5)
WBC: 6.6 10*3/uL (ref 4.0–10.5)
nRBC: 0 % (ref 0.0–0.2)

## 2021-01-02 LAB — RETICULOCYTES
Immature Retic Fract: 28.8 % — ABNORMAL HIGH (ref 2.3–15.9)
RBC.: 2.59 MIL/uL — ABNORMAL LOW (ref 4.22–5.81)
Retic Count, Absolute: 169.4 10*3/uL (ref 19.0–186.0)
Retic Ct Pct: 6.5 % — ABNORMAL HIGH (ref 0.4–3.1)

## 2021-01-02 LAB — OCCULT BLOOD X 1 CARD TO LAB, STOOL: Fecal Occult Bld: NEGATIVE

## 2021-01-02 LAB — IRON AND TIBC
Iron: 118 ug/dL (ref 45–182)
Saturation Ratios: 94 % — ABNORMAL HIGH (ref 17.9–39.5)
TIBC: 126 ug/dL — ABNORMAL LOW (ref 250–450)
UIBC: 8 ug/dL

## 2021-01-02 LAB — MAGNESIUM: Magnesium: 2.3 mg/dL (ref 1.7–2.4)

## 2021-01-02 LAB — PROTIME-INR
INR: 1.8 — ABNORMAL HIGH (ref 0.8–1.2)
Prothrombin Time: 20.8 seconds — ABNORMAL HIGH (ref 11.4–15.2)

## 2021-01-02 LAB — FERRITIN: Ferritin: 999 ng/mL — ABNORMAL HIGH (ref 24–336)

## 2021-01-02 LAB — VITAMIN B12: Vitamin B-12: 537 pg/mL (ref 180–914)

## 2021-01-02 LAB — FOLATE: Folate: 21 ng/mL (ref >5.9)

## 2021-01-02 LAB — PHOSPHORUS: Phosphorus: 4.3 mg/dL (ref 2.5–4.6)

## 2021-01-02 MED ORDER — ENSURE ENLIVE PO LIQD
237.0000 mL | Freq: Three times a day (TID) | ORAL | Status: DC
  Administered 2021-01-02 – 2021-01-05 (×5): 237 mL via ORAL

## 2021-01-02 MED ORDER — ADULT MULTIVITAMIN W/MINERALS CH
1.0000 | ORAL_TABLET | Freq: Every day | ORAL | Status: DC
  Administered 2021-01-03: 1 via ORAL
  Filled 2021-01-02: qty 1

## 2021-01-02 MED ORDER — ALBUMIN HUMAN 25 % IV SOLN
12.5000 g | Freq: Once | INTRAVENOUS | Status: AC
  Administered 2021-01-02: 12.5 g via INTRAVENOUS
  Filled 2021-01-02: qty 50

## 2021-01-02 MED ORDER — CHLORHEXIDINE GLUCONATE CLOTH 2 % EX PADS
6.0000 | MEDICATED_PAD | Freq: Every day | CUTANEOUS | Status: DC
  Administered 2021-01-02 – 2021-01-05 (×4): 6 via TOPICAL

## 2021-01-02 NOTE — Evaluation (Signed)
Physical Therapy Evaluation Patient Details Name: Cameron Miles MRN: [id number] DOB: 1951-06-14 Today's Date: 01/02/2021   History of Present Illness  Patient is a 70 y.o. male with medical history significant for untreated lymphoma, essential hypertension, newly diagnosed alcoholic cirrhosis who presented to the ED with confusion. Found to have acute hepatic encephalopathy likely secondary to noncompliance with lactulose, Presumptive UTI, AKI likely prerenal in the setting of poor oral intake with dehydration. Recently admitted at [facility] and was diagnosed with spontaneous bacterial peritonitis where he had 3 paracentesis during that admission .    Clinical Impression  Patient agreeable to PT. Some confusion with increased time required to follow single step commands. Patient has a sitter in the room and appears restless at time. Patient needs assistance for bed mobility and transfers. Patient did complain of mild dizziness with standing with standing tolerance of approximately 30 seconds. Orthostatic vitals taken during session (supine 107/60, sitting 91/62, and standing 82/55). Unable to progress ambulation due to blood pressure in standing and generalized weakness. Patient reports he is usually independent with mobility with occasional use of cane with walking. Recommend PT to follow up to maximize independence and facilitate return to prior level of function. Consider SNF placement for rehab to maximize independence and decrease caregiver burden, otherwise patient will need supervision and assistance with mobility for safety fall prevention at home.     Follow Up Recommendations SNF;Supervision for mobility/OOB    Equipment Recommendations  Rolling walker with 5" wheels (ongoing assessment based on progress)    Recommendations for Other Services       Precautions / Restrictions Precautions Precautions: Fall Restrictions Weight Bearing Restrictions: No      Mobility  Bed  Mobility Overal bed mobility: Needs Assistance Bed Mobility: Supine to Sit;Sit to Supine     Supine to sit: Mod assist Sit to supine: Mod assist   General bed mobility comments: assistance for trunk suppor to sit upright. assistance for BLE support to return to bed. verbal and tactile cues for task initiation and sequencing    Transfers Overall transfer level: Needs assistance   Transfers: Sit to/from Stand Sit to Stand: Mod assist         General transfer comment: 2 bouts of standing performed. lifting and lowering assistance provided. othrostatic vitals taken with mobility and patient does report dizziness in standing position  Ambulation/Gait             General Gait Details: unable to safely progress activity due to blood pressure in standing and generalized weakness  Stairs            Wheelchair Mobility    Modified Rankin (Stroke Patients Only)       Balance Overall balance assessment: Needs assistance Sitting-balance support: Feet supported Sitting balance-Leahy Scale: Fair     Standing balance support: Single extremity supported Standing balance-Leahy Scale: Poor Standing balance comment: Min A required to maintain standing balance for safety due to generalized unsteadiness. standing tolerance limited to ~ 30 seconds                             Pertinent Vitals/Pain Pain Assessment: No/denies pain    Home Living Family/patient expects to be discharged to:: Private residence Living Arrangements: Spouse/significant other Available Help at Discharge: Family Type of Home: House           Additional Comments: patient with confusion during session. no family in room for questioning  Prior Function Level of Independence: Independent with assistive device(s)         Comments: Patient reports he uses a cane intermittently for ambulation, otherwise independent. patient reports no falls at home recently     Hand Dominance         Extremity/Trunk Assessment   Upper Extremity Assessment Upper Extremity Assessment: Generalized weakness    Lower Extremity Assessment Lower Extremity Assessment: Generalized weakness       Communication   Communication: No difficulties  Cognition Arousal/Alertness: Awake/alert Behavior During Therapy: Restless Overall Cognitive Status: Impaired/Different from baseline Area of Impairment: Attention;Memory;Following commands;Safety/judgement                   Current Attention Level: Alternating Memory: Decreased short-term memory Following Commands: Follows one step commands with increased time Safety/Judgement: Decreased awareness of safety;Decreased awareness of deficits            General Comments      Exercises     Assessment/Plan    PT Assessment Patient needs continued PT services  PT Problem List Decreased strength;Decreased activity tolerance;Decreased balance;Decreased mobility;Decreased knowledge of use of DME;Decreased safety awareness;Decreased knowledge of precautions;Decreased cognition       PT Treatment Interventions DME instruction;Gait training;Stair training;Functional mobility training;Therapeutic activities;Therapeutic exercise;Neuromuscular re-education;Balance training;Patient/family education    PT Goals (Current goals can be found in the Care Plan section)  Acute Rehab PT Goals Patient Stated Goal: to get stronger and go home PT Goal Formulation: With patient Time For Goal Achievement: 01/16/21 Potential to Achieve Goals: Fair    Frequency Min 2X/week   Barriers to discharge        Co-evaluation               AM-PAC PT "6 Clicks" Mobility  Outcome Measure Help needed turning from your back to your side while in a flat bed without using bedrails?: None Help needed moving from lying on your back to sitting on the side of a flat bed without using bedrails?: A Little Help needed moving to and from a bed to a chair  (including a wheelchair)?: A Little Help needed standing up from a chair using your arms (e.g., wheelchair or bedside chair)?: A Little Help needed to walk in hospital room?: A Lot Help needed climbing 3-5 steps with a railing? : A Lot 6 Click Score: 17    End of Session   Activity Tolerance: Patient limited by fatigue Patient left: in bed;with call bell/phone within reach;with bed alarm set;with nursing/sitter in room (1:1 sitter in the room during session) Nurse Communication:  (alerted sitter of orthostativc vitals taken) PT Visit Diagnosis: Unsteadiness on feet (R26.81);Muscle weakness (generalized) (M62.81)    Time: 0981-1914 PT Time Calculation (min) (ACUTE ONLY): 15 min   Charges:   PT Evaluation $PT Eval Moderate Complexity: 1 Mod         Minna Merritts, PT, MPT   Percell Locus 01/02/2021, 11:50 AM

## 2021-01-02 NOTE — Evaluation (Signed)
Occupational Therapy Evaluation Patient Details Name: Cameron Miles MRN: [id number] DOB: Apr 21, 1951 Today's Date: 01/02/2021    History of Present Illness Patient is a 70 y.o. male with medical history significant for untreated lymphoma, essential hypertension, newly diagnosed alcoholic cirrhosis who presented to the ED with confusion. Found to have acute hepatic encephalopathy likely secondary to noncompliance with lactulose, Presumptive UTI, AKI likely prerenal in the setting of poor oral intake with dehydration. Recently admitted at [facility] and was diagnosed with spontaneous bacterial peritonitis where he had 3 paracentesis during that admission .   Clinical Impression   Patient presenting with decreased I in self care, balance, functional mobility/transfers, endurance, and safety awareness.  Patient confused during session and no family present during session to confirm baseline. Pt reports living at home with wife and occasional use of SPC PTA. Pt does not endorse needing any other assistance or using any other types of AD at home. Patient currently functioning at mod A overall with self care tasks and mobility. Pt very lethargic with increased cuing to attend to tasks. Pt does ask to use urinal several times but unable to void. Pt needing assistance with clothing and placement of urinal. B hand mitts donned before therapist exits the room and sitter present in room.  Patient will benefit from acute OT to increase overall independence in the areas of ADLs, functional mobility, and safety awareness in order to safely discharge to next venue of care.    Follow Up Recommendations  SNF;Supervision/Assistance - 24 hour    Equipment Recommendations  Other (comment) (defer to next venue of care)       Precautions / Restrictions Precautions Precautions: Fall Restrictions Weight Bearing Restrictions: No      Mobility Bed Mobility Overal bed mobility: Needs Assistance Bed Mobility: Supine  to Sit;Sit to Supine     Supine to sit: Mod assist Sit to supine: Mod assist   General bed mobility comments: Pt needing cuing for technique and hand placement.    Transfers Overall transfer level: Needs assistance   Transfers: Sit to/from Stand Sit to Stand: Mod assist         General transfer comment: Pt standing for less than 1 minute with c/o dizziness.    Balance Overall balance assessment: Needs assistance Sitting-balance support: Feet supported Sitting balance-Leahy Scale: Fair     Standing balance support: Single extremity supported Standing balance-Leahy Scale: Poor Standing balance comment: Min A required to maintain standing balance for safety due to generalized unsteadiness. standing tolerance limited to ~ 30 seconds                           ADL either performed or assessed with clinical judgement   ADL Overall ADL's : Needs assistance/impaired     Grooming: Wash/dry hands;Wash/dry face;Set up;Minimal assistance;Standing                                 General ADL Comments: Pt needing assistance with clothing management and placement of urinal     Vision Patient Visual Report: No change from baseline              Pertinent Vitals/Pain Pain Assessment: Faces Faces Pain Scale: No hurt     Hand Dominance Right   Extremity/Trunk Assessment Upper Extremity Assessment Upper Extremity Assessment: Generalized weakness   Lower Extremity Assessment Lower Extremity Assessment: Generalized weakness  Cervical / Trunk Assessment Cervical / Trunk Assessment: Normal   Communication Communication Communication: No difficulties   Cognition Arousal/Alertness: Awake/alert Behavior During Therapy: Restless Overall Cognitive Status: Impaired/Different from baseline Area of Impairment: Attention;Memory;Following commands;Safety/judgement                   Current Attention Level: Sustained Memory: Decreased short-term  memory Following Commands: Follows one step commands with increased time Safety/Judgement: Decreased awareness of safety;Decreased awareness of deficits     General Comments: Pt oriented to self and location. Pt fatigues quickly with eyese closed several times during session.              Home Living Family/patient expects to be discharged to:: Private residence Living Arrangements: Spouse/significant other Available Help at Discharge: Family Type of Home: House                           Additional Comments: patient with confusion during session. no family in room for questioning      Prior Functioning/Environment Level of Independence: Independent with assistive device(s)        Comments: Patient reports he uses a cane intermittently for ambulation, otherwise independent. patient reports no falls at home recently        OT Problem List: Decreased strength;Impaired balance (sitting and/or standing);Decreased cognition;Decreased knowledge of precautions;Decreased safety awareness;Decreased activity tolerance;Decreased knowledge of use of DME or AE      OT Treatment/Interventions: Self-care/ADL training;Visual/perceptual remediation/compensation;Therapeutic exercise;Patient/family education;Modalities;Neuromuscular education;Energy conservation;Therapeutic activities;Balance training;DME and/or AE instruction;Cognitive remediation/compensation    OT Goals(Current goals can be found in the care plan section) Acute Rehab OT Goals Patient Stated Goal: to get stronger and go home OT Goal Formulation: With patient Time For Goal Achievement: 01/16/21 Potential to Achieve Goals: Good ADL Goals Pt Will Perform Grooming: with supervision;standing Pt Will Perform Lower Body Dressing: with supervision;sit to/from stand Pt Will Transfer to Toilet: with supervision;ambulating Pt Will Perform Toileting - Clothing Manipulation and hygiene: with supervision;sit to/from stand   OT Frequency: Min 2X/week   Barriers to D/C:    none known at this time          AM-PAC OT "6 Clicks" Daily Activity     Outcome Measure Help from another person eating meals?: A Little Help from another person taking care of personal grooming?: A Little Help from another person toileting, which includes using toliet, bedpan, or urinal?: A Lot Help from another person bathing (including washing, rinsing, drying)?: A Lot Help from another person to put on and taking off regular upper body clothing?: A Little Help from another person to put on and taking off regular lower body clothing?: A Lot 6 Click Score: 15   End of Session Nurse Communication: Mobility status;Precautions  Activity Tolerance: Patient limited by fatigue Patient left: in bed;with call bell/phone within reach;with bed alarm set;with nursing/sitter in room  OT Visit Diagnosis: Unsteadiness on feet (R26.81);Muscle weakness (generalized) (M62.81);History of falling (Z91.81)                Time: 2130-8657 OT Time Calculation (min): 20 min Charges:  OT General Charges $OT Visit: 1 Visit OT Evaluation $OT Eval Moderate Complexity: 1 Mod OT Treatments $Therapeutic Activity: 8-22 mins  Darleen Crocker, MS, OTR/L , CBIS ascom [phone]  01/02/21, 1:08 PM

## 2021-01-02 NOTE — Progress Notes (Signed)
Aware of Urinary retention of 725, attempts made to assist the patient up to urinate, Dr. Posey Pronto aware and in and out cath ordered, & unable to get pass the patient's prostate. Awaiting further orders.

## 2021-01-02 NOTE — Consult Note (Addendum)
Cameron Antigua, MD [address], [place], [place], [place], [address] [address], [place], [place], [place], [address] Phone: [phone]  Fax: [phone]  Consultation  Referring Provider:     Dr. Posey Pronto Primary Care Physician:  Rogers Blocker, MD Reason for Consultation:    Hepatic encephalopathy Primary gastroenterologist: [facility]  Date of Admission:  01/01/2021 Date of Consultation:  01/02/2021         HPI:   Cameron Miles is a 70 y.o. male with history of alcoholic cirrhosis, recently at [facility] first week of April with SBP, underwent paracentesis x3 over the, presented with confusion over the last 2 days.  Patient has had poor oral intake, generalized weakness over the last few days at home and noncompliance to lactulose at home.  No nausea or vomiting.  No melena or hematochezia.  Patient is also being treated for presumptive UTI on this admission  Patient conversive, but not oriented and unable to provide much history.  History obtained from patient's chart  Past Medical History:  Diagnosis Date  . Allergy   . Chicken pox   . Hyperlipidemia   . Hypertension   . Kidney stones   . Lymphoma ([place]) 05/2018    Past Surgical History:  Procedure Laterality Date  . LITHOTRIPSY  1985  . LYMPH NODE BIOPSY      Prior to Admission medications   Medication Sig Start Date End Date Taking? Authorizing Provider  aspirin EC 81 MG tablet Take 81 mg by mouth daily.    [provider]  cloNIDine (CATAPRES) 0.2 MG tablet Take 0.2 mg by mouth 2 (two) times daily. 02/10/20   [provider]  diphenhydrAMINE (BENADRYL) 25 mg capsule Take by mouth.    [provider]  ergocalciferol (VITAMIN D2) 1.25 MG (50000 UT) capsule Take by mouth.    [provider]  Loratadine (CLARITIN) 10 MG CAPS Take by mouth as needed.     [provider]    Family History  Problem Relation Age of Onset  . Stroke Mother   . Prostate cancer Brother    . Hyperlipidemia Brother   . Heart disease Brother   . Stroke Brother   . Kidney disease Brother   . Mental illness Brother   . Melanoma Brother   . Bladder Cancer Neg Hx   . Kidney cancer Neg Hx      Social History   Tobacco Use  . Smoking status: Former Smoker    Quit date: 08/08/1969    Years since quitting: 51.4  . Smokeless tobacco: Never Used  Vaping Use  . Vaping Use: Never used  Substance Use Topics  . Alcohol use: Yes    Alcohol/week: 7.0 standard drinks    Types: 7 Standard drinks or equivalent per week  . Drug use: No    Allergies as of 01/01/2021 - Review Complete 01/01/2021  Allergen Reaction Noted  . Other Nausea And Vomiting 06/10/2018    Review of Systems:    All systems reviewed and negative except where noted in HPI.   Physical Exam:  Vital signs in last 24 hours: Vitals:   01/01/21 2337 01/02/21 0402 01/02/21 0500 01/02/21 0742  BP: (!) 117/54 112/65  115/63  Pulse: (!) 107 96  (!) 105  Resp: 16 16    Temp: (!) 97.4 F (36.3 C) 97.7 F (36.5 C)  97.9 F (36.6 C)  TempSrc: Oral Oral    SpO2: 98% 96%  100%  Weight:   71 kg   Height:       Last BM Date: 01/01/21 General:   Pleasant, cooperative in NAD Head:  Normocephalic and atraumatic. Eyes:   No icterus.   Conjunctiva pink. PERRLA. Ears:  Normal auditory acuity. Neck:  Supple; no masses or thyroidomegaly Lungs: Respirations even and unlabored. Lungs clear to auscultation bilaterally.   No wheezes, crackles, or rhonchi.  Abdomen:  Soft, distended, nontender. Normal bowel sounds. No appreciable masses or hepatomegaly.  No rebound or guarding.  Neurologic: Awake, alert, conversive, but not oriented. Skin:  Intact without significant lesions or rashes. Cervical Nodes:  No significant cervical adenopathy. Psych:  Alert and cooperative. Normal affect.  LAB RESULTS: Recent Labs    01/01/21 1452 01/02/21 0620  WBC 7.4 6.6  HGB 9.6* 9.8*  HCT 27.5* 27.4*  PLT 99* 78*   BMET Recent  Labs    01/01/21 1452 01/02/21 0620  NA 128* 129*  K 5.0 5.4*  CL 97* 99  CO2 21* 19*  GLUCOSE 134* 114*  BUN 52* 51*  CREATININE 1.83* 1.45*  CALCIUM 9.1 8.8*   LFT Recent Labs    01/02/21 0620  PROT 5.6*  ALBUMIN 3.0*  AST 53*  ALT 36  ALKPHOS 87  BILITOT 6.2*   PT/INR Recent Labs    01/01/21 1452 01/02/21 0620  LABPROT 20.0* 20.8*  INR 1.7* 1.8*    STUDIES: CT Head Wo Contrast  Result Date: 01/01/2021 CLINICAL DATA:  Mental status change. Weakness. Abdominal swelling. Cirrhosis. No reported injury. EXAM: CT HEAD WITHOUT CONTRAST TECHNIQUE: Contiguous axial images were obtained from the base of the skull through the vertex without intravenous contrast. COMPARISON:  None. FINDINGS: Brain: Nonspecific moderate subcortical and periventricular white matter hypodensity, most in keeping with chronic small vessel ischemic change. No evidence of parenchymal hemorrhage or extra-axial fluid collection. No mass lesion, mass effect, or midline shift. No CT evidence of acute infarction. Cerebral volume is age appropriate. No ventriculomegaly. Vascular: No acute abnormality. Skull: No evidence of calvarial fracture. Sinuses/Orbits: The visualized paranasal sinuses are essentially clear. Other:  The mastoid air cells are unopacified. IMPRESSION: 1. No evidence of acute intracranial abnormality. 2. Moderate chronic small vessel ischemic changes in the cerebral white matter. Electronically Signed   By: Ilona Sorrel M.D.   On: 01/01/2021 16:57   US Abdomen Complete  Result Date: 01/01/2021 CLINICAL DATA:  Jaundice, acute kidney injury EXAM: ABDOMEN ULTRASOUND COMPLETE COMPARISON:  11/26/2017 abdominal sonogram FINDINGS: Gallbladder: No gallstones or wall thickening visualized. No sonographic Murphy sign noted by sonographer. Common bile duct: Diameter: 4 mm Liver: Diffusely nodular liver contour with coarsened liver parenchymal echotexture, compatible with cirrhosis. No liver masses detected.  Portal vein is patent on color Doppler imaging with normal direction of blood flow towards the liver. IVC: No abnormality visualized. Pancreas: Visualized portion unremarkable. Spleen: Size and appearance within normal limits. Right Kidney: Length: 10.4 cm. Echogenicity within normal limits. No mass or hydronephrosis visualized. Left Kidney: Length: 10.1 cm. Echogenicity within normal limits. No mass or hydronephrosis visualized. Abdominal aorta: Not visualized due to overlying bowel gas. Other findings: Moderate volume ascites, most prominent in the perihepatic and right abdomen. IMPRESSION: 1. Morphologic changes of cirrhosis.  No liver masses detected. 2. Moderate volume ascites, most prominent in the right abdomen. Electronically Signed   By: Ilona Sorrel M.D.   On: 01/01/2021 19:14      Impression / Plan:   DARIUSH MCNELLIS is a 70 y.o. 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  Last paracentesis was 8 days ago at [facility] with 9.3 L removed  Patient's abdomen is distended, would recommend repeat paracentesis at this time with fluid studies  Monitor and treat for any other underlying infections as you are.  No indication for EGD at this time  Please page GI with any signs of active GI bleeding, none present at this time  Hemoglobin stable compared to what it was at [facility] in early April  Bilirubin has been elevated to above 5 since April 2022 [facility] admission.  However, alk phos has been normal and ultrasound has not shown any evidence of biliary obstruction.  However, if bilirubin continues to increase, would recommend MRCP for further evaluation  Patient will need close follow-up with [facility] at the time  of discharge as well  Thank you for involving me in the care of this patient.      LOS: 1 day   Virgel Manifold, MD  01/02/2021, 11:40 AM

## 2021-01-02 NOTE — Progress Notes (Signed)
Initial Nutrition Assessment  DOCUMENTATION CODES:   Severe malnutrition in context of chronic illness  INTERVENTION:   Ensure Enlive po TID, each supplement provides 350 kcal and 20 grams of protein  Magic cup TID with meals, each supplement provides 290 kcal and 9 grams of protein  MVI po daily   Pt at high refeed risk; recommend monitor potassium, magnesium and phosphorus labs daily until stable  Liberalize diet   NUTRITION DIAGNOSIS:   Severe Malnutrition related to chronic illness (lymphoma, etoh abuse, cirrhosis) as evidenced by severe fat depletion,severe muscle depletion.  GOAL:   Patient will meet greater than or equal to 90% of their needs  MONITOR:   PO intake,Supplement acceptance,Labs,Weight trends,Skin,I & O's  REASON FOR ASSESSMENT:   Consult Assessment of nutrition requirement/status  ASSESSMENT:   70 y.o. male with medical history significant for untreated lymphoma, essential hypertension, newly diagnosed alcoholic cirrhosis in January 2022, former alcohol use disorder and recently diagnosed SBP on Bactrim from recent hospitalization at [facility] (from 12/02/2020 until 12/09/2020) and had 3 paracentesis' during that admission who presented to [facility] ED due to hepatic encephalopathy.   Met with pt and pt's wife in room today. Pt reports poor appetite and oral intake for several months pta. Pt reports that his appetite remains poor in hospital; pt eating only sips and bites of meals. Wife reports that patient has been drinking some vanilla Ensure at home. Pt has also been trying to eat less sodium. RD discussed with pt and wife, the importance of adequate nutrition needed to preserve lean muscle in setting of cirrhosis. RD will add supplements and MVI to help pt meet his estimated needs. RD will also liberalize pt's diet as a heart healthy diet is restrictive of protein. Pt is likely at refeed risk. Per chart, pt is down ~29lbs(16%) from his UBW; pt's UBW appears  to be ~180-185lbs. Pt and wife are aware of the weight loss but are unsure how recently it occurred r/t ascites.   Medications reviewed and include: lactulose, bactrim, ceftriaxone   Labs reviewed: Na 129(L), BUN 51(H), creat 1.45(H), P 4.3 wnl, Mg 2.3 wnl, tbili 6.2(H) Hgb 9.8(L), Hct 27.4(L), MCV 103.8(H), MCH 37.1(H)   NUTRITION - FOCUSED PHYSICAL EXAM:  Flowsheet Row Most Recent Value  Orbital Region Severe depletion  Upper Arm Region Severe depletion  Thoracic and Lumbar Region Severe depletion  Buccal Region Severe depletion  Temple Region Severe depletion  Clavicle Bone Region Severe depletion  Clavicle and Acromion Bone Region Severe depletion  Scapular Bone Region Severe depletion  Dorsal Hand Severe depletion  Patellar Region Severe depletion  Anterior Thigh Region Severe depletion  Posterior Calf Region Severe depletion  Edema (RD Assessment) None  Hair Reviewed  Eyes Reviewed  Mouth Reviewed  Skin Reviewed  Nails Reviewed     Diet Order:   Diet Order            Diet Heart Room service appropriate? Yes; Fluid consistency: Thin  Diet effective now                EDUCATION NEEDS:   Education needs have been addressed  Skin:  Skin Assessment: Reviewed RN Assessment (ecchymosis)  Last BM:  5/3- type 6  Height:   Ht Readings from Last 1 Encounters:  01/01/21 '6\' 2"'  (1.88 m)    Weight:   Wt Readings from Last 1 Encounters:  01/02/21 71 kg    Ideal Body Weight:  86.3 kg  BMI:  Body mass  index is 20.1 kg/m.  Estimated Nutritional Needs:   Kcal:  2200-2500kcal/day  Protein:  110-125g/day  Fluid:  1.8L/day  Koleen Distance MS, RD, LDN Please refer to [facility] for RD and/or RD on-call/weekend/after hours pager

## 2021-01-02 NOTE — Progress Notes (Signed)
[place] unable to complete Paracentesis because they were unable to get into contact with the patient's wife. They were unable to leave a message. MD text paged. Tried to deescalate to talk to the wife unable to let due to her being upset.

## 2021-01-02 NOTE — Clinical Social Work Note (Signed)
CSW acknowledges SNF consult. Palliative consult pending. Will follow up after recommendations made.  Dayton Scrape, [place]

## 2021-01-02 NOTE — Progress Notes (Signed)
Patient pulled out IV on L forearm. Another IV was placed on right AC with board and kerlix. Has been awake for most of night, restless. Had 3 BM during this shift. Occult stool sample results came back negative. Continues to have 1:1 sitter. Is sleeping at this moment.

## 2021-01-02 NOTE — Plan of Care (Signed)
PMT note:   Patient is resting in bed with mittens in place and a sitter at bedside. He is unable to participate in a [place] discussion. Spoke with wife, she would like to meet tomorrow at 12:00 at bedside.

## 2021-01-02 NOTE — Progress Notes (Signed)
Cameron Miles NAME: Cameron Miles    MR#:  [id number]  DATE OF BIRTH:  Oct 11, 1950  SUBJECTIVE:  patient came in from home with weakness and altered mental status. Wife at bedside. Patient has been noncompliant with his lactulose due to frequent bowel movements. He is confused has meds on and sitter in the room due to pulling of IV.  RN reported urinary retention. Patient unable to stand up to urinate due to confusion and weakness  REVIEW OF SYSTEMS:   Review of Systems  Unable to perform ROS: Mental status change   Tolerating Diet: Tolerating PT: very little --recs SNF  DRUG ALLERGIES:   Allergies  Allergen Reactions  . Other Nausea And Vomiting    Anesthesia unknown class    VITALS:  Blood pressure 118/70, pulse (!) 102, temperature 97.7 F (36.5 C), temperature source Oral, resp. rate 16, height 6\' 2"  (1.88 m), weight 71 kg, SpO2 100 %.  PHYSICAL EXAMINATION:   Physical Exam  GENERAL:  70 y.o.-year-old patient lying in the bed with no acute distress. Jaundiced, chronically ill HEENT: Head atraumatic, normocephalic. Oropharynx and nasopharynx clear. Icterus+ LUNGS: Normal breath sounds bilaterally, no wheezing, rales, rhonchi. No use of accessory muscles of respiration.  CARDIOVASCULAR: S1, S2 normal. No murmurs, rubs, or gallops.  ABDOMEN: Soft, nontender, distended. Bowel sounds present. No organomegaly or mass. Foley placed 01/02/21 EXTREMITIES: No cyanosis, clubbing or edema b/l.    NEUROLOGIC: grossly nonfocal. Deconditioned. Confused with intermittent alertness.  PSYCHIATRIC:  patient is alert with intermittent confusion  SKIN: No obvious rash, lesion, or ulcer.  LABORATORY PANEL:  CBC Recent Labs  Lab 01/02/21 0620  WBC 6.6  HGB 9.8*  HCT 27.4*  PLT 78*    Chemistries  Recent Labs  Lab 01/02/21 0620  NA 129*  K 5.4*  CL 99  CO2 19*  GLUCOSE 114*  BUN 51*  CREATININE 1.45*  CALCIUM 8.8*  MG 2.3  AST  53*  ALT 36  ALKPHOS 87  BILITOT 6.2*   Cardiac Enzymes No results for input(s): TROPONINI in the last 168 hours. RADIOLOGY:  CT Head Wo Contrast  Result Date: 01/01/2021 CLINICAL DATA:  Mental status change. Weakness. Abdominal swelling. Cirrhosis. No reported injury. EXAM: CT HEAD WITHOUT CONTRAST TECHNIQUE: Contiguous axial images were obtained from the base of the skull through the vertex without intravenous contrast. COMPARISON:  None. FINDINGS: Brain: Nonspecific moderate subcortical and periventricular white matter hypodensity, most in keeping with chronic small vessel ischemic change. No evidence of parenchymal hemorrhage or extra-axial fluid collection. No mass lesion, mass effect, or midline shift. No CT evidence of acute infarction. Cerebral volume is age appropriate. No ventriculomegaly. Vascular: No acute abnormality. Skull: No evidence of calvarial fracture. Sinuses/Orbits: The visualized paranasal sinuses are essentially clear. Other:  The mastoid air cells are unopacified. IMPRESSION: 1. No evidence of acute intracranial abnormality. 2. Moderate chronic small vessel ischemic changes in the cerebral white matter. Electronically Signed   By: Ilona Sorrel M.D.   On: 01/01/2021 16:57   US Abdomen Complete  Result Date: 01/01/2021 CLINICAL DATA:  Jaundice, acute kidney injury EXAM: ABDOMEN ULTRASOUND COMPLETE COMPARISON:  11/26/2017 abdominal sonogram FINDINGS: Gallbladder: No gallstones or wall thickening visualized. No sonographic Murphy sign noted by sonographer. Common bile duct: Diameter: 4 mm Liver: Diffusely nodular liver contour with coarsened liver parenchymal echotexture, compatible with cirrhosis. No liver masses detected. Portal vein is patent on color Doppler imaging with normal direction of  blood flow towards the liver. IVC: No abnormality visualized. Pancreas: Visualized portion unremarkable. Spleen: Size and appearance within normal limits. Right Kidney: Length: 10.4 cm.  Echogenicity within normal limits. No mass or hydronephrosis visualized. Left Kidney: Length: 10.1 cm. Echogenicity within normal limits. No mass or hydronephrosis visualized. Abdominal aorta: Not visualized due to overlying bowel gas. Other findings: Moderate volume ascites, most prominent in the perihepatic and right abdomen. IMPRESSION: 1. Morphologic changes of cirrhosis.  No liver masses detected. 2. Moderate volume ascites, most prominent in the right abdomen. Electronically Signed   By: Ilona Sorrel M.D.   On: 01/01/2021 19:14   ASSESSMENT AND PLAN:  Cameron Miles is a 70 y.o. male with medical history significant for untreated lymphoma, essential hypertension, newly diagnosed alcoholic cirrhosis in January 2022, former alcohol use disorder, recently diagnosed SBP on Bactrim from recent hospitalization at [facility] (from 12/02/2020 until 12/09/2020), had 3 paracentesis during that admission, who presented to [facility] ED due to confusion in the past 2 days.  Acute hepatic encephalopathy likely secondary to noncompliance with lactulose Cirrhosis of liver--Alcoholic, End stage --Per his wife at bedside he does not always take his lactulose because he gets tired of having frequent loose stools. --Presented with ammonia level 82 and confusion --CT head nonacute --Resume lactulose --Fall precautions, aspiration precautions, delirium precautions. --Meld score 29 associated with 19.6% estimated 61-month mortality --Palliative consult today to discuss GOC. --Surveyor, quantity in place for patient's own safety as the patient has been pulling at his leads and IV access.  Presumptive UTI, POA Likely contributing to his encephalopathy Presented with UA positive for pyuria but rare bacterai Cont bactrim  Acute urinary retention -- unable to do in and out due to catheter not able to pass according to RN -- placed Foley catheter and significant amount of urine output occurred. -- Continue Flomax -- will  attempt to remove Foley catheter and couple days if not will have to replace it and follow-up with urology as outpatient.  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  ---Palliative consult--poor prognosis. Wife is thinking about DNR  AKI likely prerenal in the setting of poor oral intake with dehydration suspected hepatorenal --Hold off diuretics --Not hypotensive at the time of this visit. --Avoid nephrotoxic agents  Macrocytic anemia --No recent data to compare --Presented with hemoglobin 9.6 with MCV of 103 --Last hemoglobin 17 from medical record in 2019 with MCV of 90.  Abdominal ascites from liver cirrhosis  abdominal ultrasound--shows moderate ascites--will try paracentesis  Hypovolemic hyponatremia due to cirrhosis Presented with serum sodium 128 Encourage oral intake if able No reported nausea or vomiting  Chronic thrombocytopenia in the setting of ESLD Platelet count 99 No reported overt bleeding  Coagulopathy due to liver disease INR 1.7 Monitor repeat INR in the morning Hold off home aspirin for now.  Essential hypertension BP is not at goal, elevated. Resume home clonidine IV antihypertensives as needed with parameters  History of SBP Per his wife at bedside patient was recently admitted at [facility] and was diagnosed with spontaneous bacterial peritonitis, was discharged on Bactrim Resume home Bactrim  History of untreated lymphoma Will need to follow-up with his oncologist outpatient.  Generalized weakness/failure to thrive/severe malnutrition Nutrition Status: Nutrition Problem: Severe Malnutrition Etiology: chronic illness (lymphoma, etoh  abuse, cirrhosis) Signs/Symptoms: severe fat depletion,severe muscle depletion  PT OT to  assess--recommends rehab however given patient's current condition not sure how much active participation he'll be able to do.  TOC for discharge planning  Palliative care to see patient today  DVT prophylaxis: SCDs.  Code Status: Full code as stated by the patient's wife at bedside although she is thinking about DNR  Family Communication:  wife at bedside Disposition Plan:TBD Consults called: GI. Level of care: Med-Surg Status is: Inpatient  Remains inpatient appropriate because:Inpatient level of care appropriate due to severity of illness Medically stable for discharge: No          TOTAL TIME TAKING CARE OF THIS PATIENT: *35 minutes.  >50% time spent on counselling and coordination of care  Note: This dictation was prepared with Dragon dictation along with smaller phrase technology. Any transcriptional errors that result from this process are unintentional.  Fritzi Mandes M.D    [facility]   CC: Primary care physician; Rogers Blocker, MDPatient ID: Isabelle Course, male   DOB: 26-Nov-1950, 70 y.o.   MRN: [id number]

## 2021-01-03 ENCOUNTER — Inpatient Hospital Stay: Payer: Medicare PPO

## 2021-01-03 DIAGNOSIS — K7290 Hepatic failure, unspecified without coma: Secondary | ICD-10-CM | POA: Diagnosis not present

## 2021-01-03 DIAGNOSIS — R339 Retention of urine, unspecified: Secondary | ICD-10-CM

## 2021-01-03 DIAGNOSIS — R188 Other ascites: Secondary | ICD-10-CM

## 2021-01-03 DIAGNOSIS — Z7189 Other specified counseling: Secondary | ICD-10-CM

## 2021-01-03 DIAGNOSIS — Z515 Encounter for palliative care: Secondary | ICD-10-CM

## 2021-01-03 LAB — AMMONIA: Ammonia: 52 umol/L — ABNORMAL HIGH (ref 9–35)

## 2021-01-03 LAB — URINE CULTURE: Culture: NO GROWTH

## 2021-01-03 MED ORDER — LORAZEPAM 2 MG/ML PO CONC: 1.0000 mg | ORAL | Status: DC | PRN

## 2021-01-03 MED ORDER — LORAZEPAM 2 MG/ML IJ SOLN
1.0000 mg | INTRAMUSCULAR | Status: DC | PRN
  Administered 2021-01-04 (×3): 1 mg via INTRAVENOUS
  Filled 2021-01-03 (×3): qty 1

## 2021-01-03 MED ORDER — CIPROFLOXACIN HCL 500 MG PO TABS
500.0000 mg | ORAL_TABLET | Freq: Every day | ORAL | Status: DC
  Administered 2021-01-03 – 2021-01-05 (×3): 500 mg via ORAL
  Filled 2021-01-03 (×3): qty 1

## 2021-01-03 MED ORDER — MORPHINE SULFATE (CONCENTRATE) 10 MG/0.5ML PO SOLN
5.0000 mg | ORAL | Status: DC | PRN
  Administered 2021-01-03 – 2021-01-04 (×2): 5 mg via ORAL
  Filled 2021-01-03 (×2): qty 0.5

## 2021-01-03 NOTE — Consult Note (Signed)
Chief Complaint: Recurrent Ascites  Referring Physician(s): Dr. Posey Pronto  Supervising Physician: Corrie Mckusick  Patient Status: [facility] - In-pt  History of Present Illness: Cameron Miles is a 70 y.o. male with history of ETOH cirrhosis, recurrent ascites, admitted to [facility] for acute hepatic encephalopathy.   He has history of recurrent ascites, requiring LVP.  His wife tells me that on recent hospitalization at [facility] he had a sequence of at least 2 LVP with 12L and then 9 L within a week.   At this time, the patient and his family have had discussions regarding end of life care with Hospice team.  He has been referred for tunneled peritoneal drain for comfort care at home.    US performed today yields a good volume of fluid.  We have tentatively assigned Friday as day for image guided tunneled peritoneal drainage.   Past Medical History:  Diagnosis Date  . Allergy   . Chicken pox   . Hyperlipidemia   . Hypertension   . Kidney stones   . Lymphoma ([place]) 05/2018    Past Surgical History:  Procedure Laterality Date  . LITHOTRIPSY  1985  . LYMPH NODE BIOPSY      Allergies: Other  Medications: Prior to Admission medications   Medication Sig Start Date End Date Taking? Authorizing Provider  albuterol (VENTOLIN HFA) 108 (90 Base) MCG/ACT inhaler Inhale 1 puff into the lungs every 6 (six) hours as needed for shortness of breath. 12/15/20  Yes [provider]  aspirin EC 81 MG tablet Take 81 mg by mouth daily.   Yes [provider]  furosemide (LASIX) 40 MG tablet Take 40 mg by mouth daily. 11/21/20  Yes [provider]  hydrOXYzine (ATARAX/VISTARIL) 10 MG tablet Take 10 mg by mouth at bedtime. 12/14/20  Yes [provider]  lactulose (CHRONULAC) 10 GM/15ML solution Take 45 mLs by mouth 3 (three) times daily. 12/11/20  Yes [provider]  megestrol (MEGACE) 400 MG/10ML suspension Take by mouth daily.   Yes [provider]  Multiple  Vitamin (MULTIVITAMIN WITH MINERALS) TABS tablet Take 1 tablet by mouth daily.   Yes [provider]  spironolactone (ALDACTONE) 50 MG tablet Take 50 mg by mouth daily. 11/07/20  Yes [provider]  sulfamethoxazole-trimethoprim (BACTRIM DS) 800-160 MG tablet Take 1 tablet by mouth daily. 12/11/20  Yes [provider]     Family History  Problem Relation Age of Onset  . Stroke Mother   . Prostate cancer Brother   . Hyperlipidemia Brother   . Heart disease Brother   . Stroke Brother   . Kidney disease Brother   . Mental illness Brother   . Melanoma Brother   . Bladder Cancer Neg Hx   . Kidney cancer Neg Hx     Social History   Socioeconomic History  . Marital status: Married    Spouse name: Not on file  . Number of children: Not on file  . Years of education: Not on file  . Highest education level: Not on file  Occupational History  . Not on file  Tobacco Use  . Smoking status: Former Smoker    Quit date: 08/08/1969    Years since quitting: 51.4  . Smokeless tobacco: Never Used  Vaping Use  . Vaping Use: Never used  Substance and Sexual Activity  . Alcohol use: Yes    Alcohol/week: 7.0 standard drinks    Types: 7 Standard drinks or equivalent per week  . Drug  use: No  . Sexual activity: Yes  Other Topics Concern  . Not on file  Social History Narrative   Retd. Mental health professional/case manager; remote hx of smoking; couple of wines at night. Lives in  [place].    Social Determinants of Health   Financial Resource Strain: Not on file  Food Insecurity: Not on file  Transportation Needs: Not on file  Physical Activity: Not on file  Stress: Not on file  Social Connections: Not on file      Review of Systems: A 12 point ROS discussed and pertinent positives are indicated in the HPI above.  All other systems are negative.  Review of Systems  Vital Signs: BP 133/73 (BP Location: Left Arm)   Pulse 99   Temp 97.6 F (36.4 C)  (Oral)   Resp 18   Ht 6\' 2"  (1.88 m)   Wt 73.6 kg   SpO2 95%   BMI 20.83 kg/m   Physical Exam General: 70 yo male appearing stated age.  Well-developed, well-nourished.  Somnolent, but responds to voice stimulation. Marland Kitchen  HEENT: Atraumatic, normocephalic.  Oral mucosa tacky Neck: Symmetric with no goiter enlargement.  Chest/Lungs:  Symmetric chest with inspiration/expiration.  No labored breathing.   Marland Kitchen  Heart:   . No JVD appreciated.  Abdomen:  Fluid wave. [place] positive for ascites.   Genito-urinary: Deferred      Imaging: CT Head Wo Contrast  Result Date: 01/01/2021 CLINICAL DATA:  Mental status change. Weakness. Abdominal swelling. Cirrhosis. No reported injury. EXAM: CT HEAD WITHOUT CONTRAST TECHNIQUE: Contiguous axial images were obtained from the base of the skull through the vertex without intravenous contrast. COMPARISON:  None. FINDINGS: Brain: Nonspecific moderate subcortical and periventricular white matter hypodensity, most in keeping with chronic small vessel ischemic change. No evidence of parenchymal hemorrhage or extra-axial fluid collection. No mass lesion, mass effect, or midline shift. No CT evidence of acute infarction. Cerebral volume is age appropriate. No ventriculomegaly. Vascular: No acute abnormality. Skull: No evidence of calvarial fracture. Sinuses/Orbits: The visualized paranasal sinuses are essentially clear. Other:  The mastoid air cells are unopacified. IMPRESSION: 1. No evidence of acute intracranial abnormality. 2. Moderate chronic small vessel ischemic changes in the cerebral white matter. Electronically Signed   By: Ilona Sorrel M.D.   On: 01/01/2021 16:57   US Abdomen Complete  Result Date: 01/01/2021 CLINICAL DATA:  Jaundice, acute kidney injury EXAM: ABDOMEN ULTRASOUND COMPLETE COMPARISON:  11/26/2017 abdominal sonogram FINDINGS: Gallbladder: No gallstones or wall thickening visualized. No sonographic Murphy sign noted by sonographer. Common bile duct:  Diameter: 4 mm Liver: Diffusely nodular liver contour with coarsened liver parenchymal echotexture, compatible with cirrhosis. No liver masses detected. Portal vein is patent on color Doppler imaging with normal direction of blood flow towards the liver. IVC: No abnormality visualized. Pancreas: Visualized portion unremarkable. Spleen: Size and appearance within normal limits. Right Kidney: Length: 10.4 cm. Echogenicity within normal limits. No mass or hydronephrosis visualized. Left Kidney: Length: 10.1 cm. Echogenicity within normal limits. No mass or hydronephrosis visualized. Abdominal aorta: Not visualized due to overlying bowel gas. Other findings: Moderate volume ascites, most prominent in the perihepatic and right abdomen. IMPRESSION: 1. Morphologic changes of cirrhosis.  No liver masses detected. 2. Moderate volume ascites, most prominent in the right abdomen. Electronically Signed   By: Ilona Sorrel M.D.   On: 01/01/2021 19:14    Labs:  CBC: Recent Labs    01/01/21 1452 01/02/21 0620  WBC 7.4 6.6  HGB 9.6* 9.8*  HCT 27.5* 27.4*  PLT 99* 78*    COAGS: Recent Labs    01/01/21 1452 01/02/21 0620  INR 1.7* 1.8*    BMP: Recent Labs    01/01/21 1452 01/02/21 0620  NA 128* 129*  K 5.0 5.4*  CL 97* 99  CO2 21* 19*  GLUCOSE 134* 114*  BUN 52* 51*  CALCIUM 9.1 8.8*  CREATININE 1.83* 1.45*  GFRNONAA 39* 52*    LIVER FUNCTION TESTS: Recent Labs    01/01/21 1452 01/02/21 0620  BILITOT 5.5* 6.2*  AST 51* 53*  ALT 38 36  ALKPHOS 101 87  PROT 6.2* 5.6*  ALBUMIN 3.5 3.0*    TUMOR MARKERS: No results for input(s): AFPTM, CEA, CA199, CHROMGRNA in the last 8760 hours.  Assessment and Plan:  70 yo male with recurrent ascites, establishing end of life care, referred for tunneled peritoneal catheter for comfort at home with Hospice.   I have discussed timing of peritoneal drainage as well as the logistics of the procedure with his wife.   Risks and benefits discussed  with the patient's wife/POA including bleeding, infection, damage to adjacent structures, bowel perforation/fistula connection, and sepsis.  All of the patient's questions were answered, patient is agreeable to proceed. Consent signed and in chart.  Tentatively scheduled for Friday.   Electronically Signed: Corrie Mckusick, DO 01/03/2021, 1:39 PM   I spent a total of 40 Minutes    in face to face in clinical consultation, greater than 50% of which was counseling/coordinating care for recurrent ascites, tunneled peritoneal drainage catheter

## 2021-01-03 NOTE — Progress Notes (Signed)
[facility] NAME: Cameron Miles    MR#:  [id number]  DATE OF BIRTH:  [birth year]  SUBJECTIVE:  patient came in from home with weakness and altered mental status. Wife at bedside. Patient has been noncompliant with his lactulose due to frequent bowel movements. H  Patient lethargic earlier. Wife in the room. Has been drinking intermittently some ensure. Unable to hold meaningful conversation  REVIEW OF SYSTEMS:   Review of Systems  Unable to perform ROS: Mental status change   Tolerating Diet: Tolerating PT: very little --recs SNF  DRUG ALLERGIES:   Allergies  Allergen Reactions  . Other Nausea And Vomiting    Anesthesia unknown class    VITALS:  Blood pressure 133/73, pulse 99, temperature 97.6 F (36.4 C), temperature source Oral, resp. rate 18, height 6\' 2"  (1.88 m), weight 73.6 kg, SpO2 95 %.  PHYSICAL EXAMINATION:   Physical Exam  GENERAL:  70 y.o.-year-old patient lying in the bed with no acute distress. Jaundiced, chronically ill HEENT: Head atraumatic, normocephalic. Oropharynx and nasopharynx clear. Icterus+ LUNGS: Normal breath sounds bilaterally, no wheezing, rales, rhonchi. No use of accessory muscles of respiration.  CARDIOVASCULAR: S1, S2 normal. No murmurs, rubs, or gallops.  ABDOMEN: Soft, nontender, distended. Bowel sounds present. No organomegaly or mass. Foley placed 01/02/21 EXTREMITIES: No cyanosis, clubbing or edema b/l.    NEUROLOGIC: grossly nonfocal. Deconditioned. Confused with intermittent alertness.  PSYCHIATRIC:  patient is alert with intermittent confusion  SKIN: No obvious rash, lesion, or ulcer.  LABORATORY PANEL:  CBC Recent Labs  Lab 01/02/21 0620  WBC 6.6  HGB 9.8*  HCT 27.4*  PLT 78*    Chemistries  Recent Labs  Lab 01/02/21 0620  NA 129*  K 5.4*  CL 99  CO2 19*  GLUCOSE 114*  BUN 51*  CREATININE 1.45*  CALCIUM 8.8*  MG 2.3  AST 53*  ALT 36  ALKPHOS 87  BILITOT 6.2*    Cardiac Enzymes No results for input(s): TROPONINI in the last 168 hours. RADIOLOGY:  CT Head Wo Contrast  Result Date: 01/01/2021 CLINICAL DATA:  Mental status change. Weakness. Abdominal swelling. Cirrhosis. No reported injury. EXAM: CT HEAD WITHOUT CONTRAST TECHNIQUE: Contiguous axial images were obtained from the base of the skull through the vertex without intravenous contrast. COMPARISON:  None. FINDINGS: Brain: Nonspecific moderate subcortical and periventricular white matter hypodensity, most in keeping with chronic small vessel ischemic change. No evidence of parenchymal hemorrhage or extra-axial fluid collection. No mass lesion, mass effect, or midline shift. No CT evidence of acute infarction. Cerebral volume is age appropriate. No ventriculomegaly. Vascular: No acute abnormality. Skull: No evidence of calvarial fracture. Sinuses/Orbits: The visualized paranasal sinuses are essentially clear. Other:  The mastoid air cells are unopacified. IMPRESSION: 1. No evidence of acute intracranial abnormality. 2. Moderate chronic small vessel ischemic changes in the cerebral white matter. Electronically Signed   By: Ilona Sorrel M.D.   On: 01/01/2021 16:57   US Abdomen Complete  Result Date: 01/01/2021 CLINICAL DATA:  Jaundice, acute kidney injury EXAM: ABDOMEN ULTRASOUND COMPLETE COMPARISON:  11/26/2017 abdominal sonogram FINDINGS: Gallbladder: No gallstones or wall thickening visualized. No sonographic Murphy sign noted by sonographer. Common bile duct: Diameter: 4 mm Liver: Diffusely nodular liver contour with coarsened liver parenchymal echotexture, compatible with cirrhosis. No liver masses detected. Portal vein is patent on color Doppler imaging with normal direction of blood flow towards the liver. IVC: No abnormality visualized. Pancreas: Visualized portion unremarkable. Spleen:  Size and appearance within normal limits. Right Kidney: Length: 10.4 cm. Echogenicity within normal limits. No mass or  hydronephrosis visualized. Left Kidney: Length: 10.1 cm. Echogenicity within normal limits. No mass or hydronephrosis visualized. Abdominal aorta: Not visualized due to overlying bowel gas. Other findings: Moderate volume ascites, most prominent in the perihepatic and right abdomen. IMPRESSION: 1. Morphologic changes of cirrhosis.  No liver masses detected. 2. Moderate volume ascites, most prominent in the right abdomen. Electronically Signed   By: Ilona Sorrel M.D.   On: 01/01/2021 19:14   ASSESSMENT AND PLAN:  Cameron Miles is a 70 y.o. male with medical history significant for untreated lymphoma, essential hypertension, newly diagnosed alcoholic cirrhosis in January 2022, former alcohol use disorder, recently diagnosed SBP on Bactrim from recent hospitalization at [facility] (from 12/02/2020 until 12/09/2020), had 3 paracentesis during that admission, who presented to [facility] ED due to confusion in the past 2 days.  Acute hepatic encephalopathy likely secondary to noncompliance with lactulose Cirrhosis of liver--Alcoholic, End stage --Per his wife at bedside he does not always take his lactulose because he gets tired of having frequent loose stools. --Presented with ammonia level 82 and confusion --CT head nonacute --Resume lactulose --Fall precautions, aspiration precautions, delirium precautions. --Meld score 29 associated with 19.6% estimated 46-month mortality --Palliative consult appreciated. Detailed discussion was earlier led by me and palliative care with wife. Requesting home with hospice. Wife understands patient's poor prognosis. Patient is DNR --Pleurax drain to be placed for comfort measures for recurrent malignant ascites. Scheduled for Friday -- continue Cipro for SBP prophylaxis  Presumptive UTI, POA Likely contributing to his encephalopathy Presented with UA positive for pyuria but rare bacterai Cont Cipro  Acute urinary retention -- unable to do in and out due to catheter not  able to pass according to RN -- placed Foley catheter and significant amount of urine output occurred. 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   AKI likely prerenal in the setting of poor oral intake with dehydration suspected hepatorenal --Hold off diuretics --Not hypotensive at the time of this visit. --Avoid nephrotoxic agents  Macrocytic anemia --No recent data to compare --Presented with hemoglobin 9.6 with MCV of 103 --Last hemoglobin 17 from medical record in 2019 with MCV of 90.  Abdominal ascites from liver cirrhosis -- abdominal ultrasound--shows moderate ascites. Pt will get Pleurax on friday  Hypovolemic hyponatremia due to cirrhosis Presented with serum sodium 128 Encourage oral intake if able No reported nausea or vomiting  Chronic thrombocytopenia in the setting of ESLD Platelet count 99  Coagulopathy due to liver disease INR 1.7 Hold off home aspirin for now.  Essential hypertension BP is not at goal, elevated. Resume home clonidine IV antihypertensives as needed with parameters  History of SBP Per his wife at bedside patient was recently admitted at [facility] and was diagnosed with spontaneous bacterial peritonitis, was discharged on Bactrim -On cipro  History of untreated lymphoma Will need to follow-up with his oncologist outpatient.  Generalized weakness/failure to thrive/severe malnutrition Nutrition Status: Nutrition Problem: Severe Malnutrition Etiology: chronic illness (lymphoma, etoh abuse, cirrhosis) Signs/Symptoms: severe fat depletion,severe muscle depletion  PT OT to assess--recommends rehab however given patient's current  condition not  sure how much active participation he'll be able to do.  TOC for discharge planning  Palliative care to see patient today  DVT prophylaxis: SCDs.  Code Status: Full code as stated by the patient's wife at bedside although she is thinking about DNR  Family Communication:  wife at bedside Disposition Plan:TBD Consults called: GI. Level of care: Med-Surg Status is: Inpatient  Remains inpatient appropriate because:Inpatient level of care appropriate due to severity of illness Medically stable for discharge: No     TOTAL TIME TAKING CARE OF THIS PATIENT: *25 minutes.  >50% time spent on counselling and coordination of care  Note: This dictation was prepared with Dragon dictation along with smaller phrase technology. Any transcriptional errors that result from this process are unintentional.  Fritzi Mandes M.D    [facility]   CC: Primary care physician; Rogers Blocker, MDPatient ID: Cameron Miles, male   DOB: 03-Feb-1951, 70 y.o.   MRN: [id number]

## 2021-01-03 NOTE — Progress Notes (Signed)
Mobility Specialist - Progress Note   01/03/21 1000  Mobility  Activity Contraindicated/medical hold  Mobility performed by Mobility specialist    Per chart review, pt with elevated K+ of 5.4 this date, contraindicating mobility. Will attempt session at another date/time as medically appropriate.    Kathee Delton Mobility Specialist 01/03/21, 10:52 AM

## 2021-01-03 NOTE — Progress Notes (Signed)
DNR bracelet placed and verified with Zoira,RN

## 2021-01-03 NOTE — Consult Note (Signed)
Consultation Note Date: 01/03/2021   Patient Name: Cameron Miles  DOB: [birth year]  MRN: [id number]  Age / Sex: 70 y.o., male  PCP: Rogers Blocker, MD Referring Physician: Fritzi Mandes, MD  Reason for Consultation: Establishing goals of care  HPI/Patient Profile: Cameron Miles is a 70 y.o. male with medical history significant for untreated lymphoma, essential hypertension, newly diagnosed alcoholic cirrhosis in January 2022, former alcohol use disorder, recently diagnosed SBP on Bactrim from recent hospitalization at [facility] (from 12/02/2020 until 12/09/2020), had 3 paracentesis during that admission, who presented to [facility] ED due to confusion in the past 2 days.   Clinical Assessment and Goals of Care: Patient is resting in bed. He is confused. Wife and pastor is at bedside.   Wife states she lives at home with patient. She states his QOL has been very poor. She is aware of his diagnoses. She states he is a very private person and will not discuss his health with anyone but her and their pastor. She states he does not like to need to receive care or to be bed bound.  We discussed his diagnoses, prognosis, GOC, EOL wishes disposition and options.  A detailed discussion was had today regarding advanced directives.  Concepts specific to code status, artifical feeding and hydration, IV antibiotics and rehospitalization were discussed.  The difference between an aggressive medical intervention path and a comfort care path was discussed.  Values and goals of care important to patient and family were attempted to be elicited.  Discussed limitations of medical interventions to prolong quality of life in some situations and discussed the concept of human mortality.  Wife states he does not want to take his lactulose as it gives him diarrhea. He has a very poor appetite because he is not hungry. He has had to have  paracentesis procedures with increasing frequency.  She states she believes he would want to be at home with hospice. She worked very hard to try to get him to make the decision but he would fall asleep during the conversation or say "I don't know". He does state he would be okay with his wife making the decisions on his care. He also states he does not want to return to the hospital. All questions answered and multiple scenarios discussed.   Wife decides on home with hospice. She would like to continue his prophylactic antibiotic for peritonitis, if he is willing to take it.       I completed a MOST form today with wife, and the signed original was placed in the chart. A photocopy was placed in the chart to be scanned into EMR. The patient outlined their wishes for the following treatment decisions:  Cardiopulmonary Resuscitation: Do Not Attempt Resuscitation (DNR/No CPR)  Medical Interventions: Comfort Measures: Keep clean, warm, and dry. Use medication by any route, positioning, wound care, and other measures to relieve pain and suffering. Use oxygen, suction and manual treatment of airway obstruction as needed for comfort. Do not transfer to the  hospital unless comfort needs cannot be met in current location.  Antibiotics: Antibiotics if indicated  IV Fluids: No IV fluids (provide other measures to ensure comfort)  Feeding Tube: No feeding tube    [place] with hospice.   Prognosis:   < 2 weeks  He does not want to use lactulose. Minimal PO intake.       Primary Diagnoses: Present on Admission: **None**   I have reviewed the medical record, interviewed the patient and family, and examined the patient. The following aspects are pertinent.  Past Medical History:  Diagnosis Date  . Allergy   . Chicken pox   . Hyperlipidemia   . Hypertension   . Kidney stones   . Lymphoma ([place]) 05/2018   Social History   Socioeconomic History  . Marital status:  Married    Spouse name: Not on file  . Number of children: Not on file  . Years of education: Not on file  . Highest education level: Not on file  Occupational History  . Not on file  Tobacco Use  . Smoking status: Former Smoker    Quit date: 08/08/1969    Years since quitting: 51.4  . Smokeless tobacco: Never Used  Vaping Use  . Vaping Use: Never used  Substance and Sexual Activity  . Alcohol use: Yes    Alcohol/week: 7.0 standard drinks    Types: 7 Standard drinks or equivalent per week  . Drug use: No  . Sexual activity: Yes  Other Topics Concern  . Not on file  Social History Narrative   Retd. Mental health professional/case manager; remote hx of smoking; couple of wines at night. Lives in  [place].    Social Determinants of Health   Financial Resource Strain: Not on file  Food Insecurity: Not on file  Transportation Needs: Not on file  Physical Activity: Not on file  Stress: Not on file  Social Connections: Not on file   Family History  Problem Relation Age of Onset  . Stroke Mother   . Prostate cancer Brother   . Hyperlipidemia Brother   . Heart disease Brother   . Stroke Brother   . Kidney disease Brother   . Mental illness Brother   . Melanoma Brother   . Bladder Cancer Neg Hx   . Kidney cancer Neg Hx    Scheduled Meds: . Chlorhexidine Gluconate Cloth  6 each Topical Daily  . ciprofloxacin  500 mg Oral Daily  . cloNIDine  0.2 mg Oral BID  . feeding supplement  237 mL Oral TID BM  . lactulose  30 g Oral BID  . multivitamin with minerals  1 tablet Oral Daily   Continuous Infusions: PRN Meds:.metoprolol tartrate, ondansetron (ZOFRAN) IV Medications Prior to Admission:  Prior to Admission medications   Medication Sig Start Date End Date Taking? Authorizing Provider  albuterol (VENTOLIN HFA) 108 (90 Base) MCG/ACT inhaler Inhale 1 puff into the lungs every 6 (six) hours as needed for shortness of breath. 12/15/20  Yes [provider]  aspirin  EC 81 MG tablet Take 81 mg by mouth daily.   Yes [provider]  furosemide (LASIX) 40 MG tablet Take 40 mg by mouth daily. 11/21/20  Yes [provider]  hydrOXYzine (ATARAX/VISTARIL) 10 MG tablet Take 10 mg by mouth at bedtime. 12/14/20  Yes [provider]  lactulose (CHRONULAC) 10 GM/15ML solution Take 45 mLs by mouth 3 (three) times daily. 12/11/20  Yes [provider]  megestrol (MEGACE) 400 MG/10ML suspension Take by mouth daily.   Yes [provider]  Multiple Vitamin (MULTIVITAMIN WITH MINERALS) TABS tablet Take 1 tablet by mouth daily.   Yes [provider]  spironolactone (ALDACTONE) 50 MG tablet Take 50 mg by mouth daily. 11/07/20  Yes [provider]  sulfamethoxazole-trimethoprim (BACTRIM DS) 800-160 MG tablet Take 1 tablet by mouth daily. 12/11/20  Yes [provider]   Allergies  Allergen Reactions  . Other Nausea And Vomiting    Anesthesia unknown class   Review of Systems  Unable to perform ROS   Physical Exam Constitutional:      Comments: Arouses but falls asleep in conversation.   Abdominal:     General: There is distension.     Vital Signs: BP 133/73 (BP Location: Left Arm)   Pulse 99   Temp 97.6 F (36.4 C) (Oral)   Resp 18   Ht '6\' 2"'  (1.88 m)   Wt 73.6 kg   SpO2 95%   BMI 20.83 kg/m  Pain Scale: Faces   Pain Score: 0-No pain   SpO2: SpO2: 95 % O2 Device:SpO2: 95 % O2 Flow Rate: .   IO: Intake/output summary:   Intake/Output Summary (Last 24 hours) at 01/03/2021 1234 Last data filed at 01/03/2021 1000 Gross per 24 hour  Intake 1693.65 ml  Output 1050 ml  Net 643.65 ml    LBM: Last BM Date: 01/01/21 Baseline Weight: Weight: 76.6 kg Most recent weight: Weight: 73.6 kg         Time In: 11:30 Time Out: 12:40 Time Total: 70 min Greater than 50%  of this time was spent counseling and coordinating care related to the above assessment and plan.  Signed by: Asencion Gowda,  NP   Please contact Palliative Medicine Team phone at [phone] for questions and concerns.  For individual provider: See Shea Evans

## 2021-01-03 NOTE — Progress Notes (Addendum)
[facility]) Hospital Liaison RN note:  Received request from Asencion Gowda, NP for hospice services at home after discharge. Chart and patient information under review by [facility] physician and hospice eligibility was approved.  Attempted to call spouse, Jenny Reichmann over the phone to initiate education related to hospice philosophy. LVM. Attempted to visit patient in room and he was in ultrasound.  Per discussion with NP, spouse was requesting a hospital bed. [place] Liaison has ordered a hospital bed and over the bed table. Dayton Scrape, TOC is aware. Plan is for placement of pleurex drain Friday.  Please send signed and completed DNR home with patient. Please provide prescriptions at discharge as needed to ensure ongoing symptom management.  Please call with hospice related questions or concerns.  Thank you for the opportunity to participate in this patient's care.  Zandra Abts, RN [facility] Liaison [phone]

## 2021-01-03 NOTE — Progress Notes (Signed)
Cameron Antigua, MD [address], [place], [place], [place], [address] [address], [place], [place], [place], [address] Phone: [phone]  Fax: [phone]   Subjective: Patient resting in bed comfortably.  No acute events overnight   Objective: Exam: Vital signs in last 24 hours: Vitals:   01/02/21 2320 01/03/21 0349 01/03/21 0357 01/03/21 0716  BP: 129/68 131/74  133/73  Pulse: (!) 106 (!) 102  99  Resp: '17 17  18  ' Temp: 98.7 F (37.1 C) 97.9 F (36.6 C)  97.6 F (36.4 C)  TempSrc: Oral Oral  Oral  SpO2: 97% 96%  95%  Weight:   73.6 kg   Height:       Weight change: -3 kg  Intake/Output Summary (Last 24 hours) at 01/03/2021 1603 Last data filed at 01/03/2021 1359 Gross per 24 hour  Intake 1813.65 ml  Output 350 ml  Net 1463.65 ml    General: No acute distress, alert Abd: Soft, NT/ND, No HSM Skin: Warm, no rashes Neck: Supple, Trachea midline   Lab Results: Lab Results  Component Value Date   WBC 6.6 01/02/2021   HGB 9.8 (L) 01/02/2021   HCT 27.4 (L) 01/02/2021   MCV 103.8 (H) 01/02/2021   PLT 78 (L) 01/02/2021   Micro Results: Recent Results (from the past 240 hour(s))  Resp Panel by RT-PCR (Flu A&B, Covid) Nasopharyngeal Swab     Status: None   Collection Time: 01/01/21  3:53 PM   Specimen: Nasopharyngeal Swab; Nasopharyngeal(NP) swabs in vial transport medium  Result Value Ref Range Status   SARS Coronavirus 2 by RT PCR NEGATIVE NEGATIVE Final    Comment: (NOTE) SARS-CoV-2 target nucleic acids are NOT DETECTED.  The SARS-CoV-2 RNA is generally detectable in upper respiratory specimens during the acute phase of infection. The lowest concentration of SARS-CoV-2 viral copies this assay can detect is 138 copies/mL. A negative result does not preclude SARS-Cov-2 infection and should not be used as the sole basis for treatment or other patient management decisions. A negative result may occur with  improper specimen collection/handling,  submission of specimen other than nasopharyngeal swab, presence of viral mutation(s) within the areas targeted by this assay, and inadequate number of viral copies(<138 copies/mL). A negative result must be combined with clinical observations, patient history, and epidemiological information. The expected result is Negative.  Fact Sheet for Patients:  EntrepreneurPulse.com.au  Fact Sheet for Healthcare Providers:  IncredibleEmployment.be  This test is no t yet approved or cleared by the [place] FDA and  has been authorized for detection and/or diagnosis of SARS-CoV-2 by FDA under an Emergency Use Authorization (EUA). This EUA will remain  in effect (meaning this test can be used) for the duration of the COVID-19 declaration under Section 564(b)(1) of the Act, 21 U.S.C.section 360bbb-3(b)(1), unless the authorization is terminated  or revoked sooner.       Influenza A by PCR NEGATIVE NEGATIVE Final   Influenza B by PCR NEGATIVE NEGATIVE Final    Comment: (NOTE) The Xpert Xpress SARS-CoV-2/FLU/RSV plus assay is intended as an aid in the diagnosis of influenza from Nasopharyngeal swab specimens and should not be used as a sole basis for treatment. Nasal washings and aspirates are unacceptable for Xpert Xpress SARS-CoV-2/FLU/RSV testing.  Fact Sheet for Patients: EntrepreneurPulse.com.au  Fact Sheet for Healthcare Providers: IncredibleEmployment.be  This test is not yet approved or cleared by the [place] FDA and has been authorized for detection and/or diagnosis of SARS-CoV-2 by FDA under an Emergency  Use Authorization (EUA). This EUA will remain in effect (meaning this test can be used) for the duration of the COVID-19 declaration under Section 564(b)(1) of the Act, 21 U.S.C. section 360bbb-3(b)(1), unless the authorization is terminated or revoked.  Performed at [facility], [address], [place], [place] [address]   Urine Culture     Status: None   Collection Time: 01/01/21  6:03 PM   Specimen: Urine, Random  Result Value Ref Range Status   Specimen Description   Final    URINE, RANDOM Performed at [facility], [address], [place], [place] [address]    Special Requests   Final    NONE Performed at [facility], [address] [place], [place] [address]    Culture   Final    NO GROWTH Performed at [facility], [place] [address], [place], [place] [address]    Report Status 01/03/2021 FINAL  Final  CULTURE, BLOOD (ROUTINE X 2) w Reflex to ID Panel     Status: None (Preliminary result)   Collection Time: 01/01/21 11:23 PM   Specimen: BLOOD  Result Value Ref Range Status   Specimen Description BLOOD BLOOD LEFT HAND  Final   Special Requests   Final    BOTTLES DRAWN AEROBIC AND ANAEROBIC Blood Culture adequate volume   Culture   Final    NO GROWTH 1 DAY Performed at [facility], [address] [place], [place] [address]    Report Status PENDING  Incomplete  CULTURE, BLOOD (ROUTINE X 2) w Reflex to ID Panel     Status: None (Preliminary result)   Collection Time: 01/01/21 11:24 PM   Specimen: BLOOD  Result Value Ref Range Status   Specimen Description BLOOD BLOOD RIGHT HAND  Final   Special Requests   Final    BOTTLES DRAWN AEROBIC AND ANAEROBIC Blood Culture adequate volume   Culture   Final    NO GROWTH 1 DAY Performed at [facility], [address] [place] [address], [place], [place] [address]    Report Status PENDING  Incomplete   Studies/Results: CT Head Wo Contrast  Result Date: 01/01/2021 CLINICAL DATA:  Mental status change. Weakness. Abdominal swelling. Cirrhosis. No reported injury. EXAM: CT HEAD WITHOUT CONTRAST TECHNIQUE: Contiguous axial images were obtained from the base of the skull through the vertex without intravenous contrast. COMPARISON:  None. FINDINGS: Brain: Nonspecific moderate subcortical  and periventricular white matter hypodensity, most in keeping with chronic small vessel ischemic change. No evidence of parenchymal hemorrhage or extra-axial fluid collection. No mass lesion, mass effect, or midline shift. No CT evidence of acute infarction. Cerebral volume is age appropriate. No ventriculomegaly. Vascular: No acute abnormality. Skull: No evidence of calvarial fracture. Sinuses/Orbits: The visualized paranasal sinuses are essentially clear. Other:  The mastoid air cells are unopacified. IMPRESSION: 1. No evidence of acute intracranial abnormality. 2. Moderate chronic small vessel ischemic changes in the cerebral white matter. Electronically Signed   By: Ilona Sorrel M.D.   On: 01/01/2021 16:57   US Abdomen Complete  Result Date: 01/01/2021 CLINICAL DATA:  Jaundice, acute kidney injury EXAM: ABDOMEN ULTRASOUND COMPLETE COMPARISON:  11/26/2017 abdominal sonogram FINDINGS: Gallbladder: No gallstones or wall thickening visualized. No sonographic Murphy sign noted by sonographer. Common bile duct: Diameter: 4 mm Liver: Diffusely nodular liver contour with coarsened liver parenchymal echotexture, compatible with cirrhosis. No liver masses detected. Portal vein is patent on color Doppler imaging with normal direction of blood flow towards the liver.  IVC: No abnormality visualized. Pancreas: Visualized portion unremarkable. Spleen: Size and appearance within normal limits. Right Kidney: Length: 10.4 cm. Echogenicity within normal limits. No mass or hydronephrosis visualized. Left Kidney: Length: 10.1 cm. Echogenicity within normal limits. No mass or hydronephrosis visualized. Abdominal aorta: Not visualized due to overlying bowel gas. Other findings: Moderate volume ascites, most prominent in the perihepatic and right abdomen. IMPRESSION: 1. Morphologic changes of cirrhosis.  No liver masses detected. 2. Moderate volume ascites, most prominent in the right abdomen. Electronically Signed   By: Ilona Sorrel M.D.   On: 01/01/2021 19:14   [place] ASCITES (ABDOMEN LIMITED)  Result Date: 01/03/2021 CLINICAL DATA:  70 year old male presents for possible paracentesis. There is a pending tunneled peritoneal drainage catheter on schedule EXAM: LIMITED ABDOMEN ULTRASOUND FOR ASCITES TECHNIQUE: Limited ultrasound survey for ascites was performed in all four abdominal quadrants. COMPARISON:  None. FINDINGS: Ultrasound images demonstrate moderate volume of ascites. After discussion with the primary team regarding the patient's upcoming tunneled peritoneal drainage catheter, we deferred a paracentesis at this time. IMPRESSION: Ultrasound demonstrates adequate ascites for upcoming safe placement of tunneled peritoneal drainage catheter Electronically Signed   By: Corrie Mckusick D.O.   On: 01/03/2021 14:54   Medications:  Scheduled Meds: . Chlorhexidine Gluconate Cloth  6 each Topical Daily  . ciprofloxacin  500 mg Oral Daily  . cloNIDine  0.2 mg Oral BID  . feeding supplement  237 mL Oral TID BM  . lactulose  30 g Oral BID   Continuous Infusions: PRN Meds:.LORazepam, metoprolol tartrate, morphine CONCENTRATE, ondansetron (ZOFRAN) IV   Assessment: Active Problems:   Hepatic encephalopathy (HCC)   Protein-calorie malnutrition, severe   AKI (acute kidney injury) ([employer])   Jaundice   Alcoholic cirrhosis of liver with ascites ([place])    Plan: Patient has met with hospice team, and referral has been placed to IR for catheter placement for comfort at home for peritoneal drainage  Given the above, discussion with palliative care with plans for home hospice, no labs have been done today in regard to following his liver enzymes  Confusion improving   LOS: 2 days   Cameron Antigua, MD 01/03/2021, 4:03 PM

## 2021-01-04 ENCOUNTER — Inpatient Hospital Stay: Payer: Medicare PPO

## 2021-01-04 DIAGNOSIS — K7290 Hepatic failure, unspecified without coma: Secondary | ICD-10-CM | POA: Diagnosis not present

## 2021-01-04 DIAGNOSIS — R188 Other ascites: Secondary | ICD-10-CM | POA: Diagnosis not present

## 2021-01-04 MED ORDER — CEFAZOLIN SODIUM-DEXTROSE 2-4 GM/100ML-% IV SOLN
2.0000 g | INTRAVENOUS | Status: DC
  Filled 2021-01-04: qty 100

## 2021-01-04 NOTE — Progress Notes (Signed)
Patient placed on schedule tomorrow am for abdominal pleurx catheter placement since we now have kit in stock, took pleurx catheter packet to nurse  on floor as well as spoke with  DR Posey Pronto to get palliative care involved with getting supplies for this patient prior to discharge and hospice/home health.

## 2021-01-04 NOTE — Progress Notes (Signed)
Cameron Miles NAME: Cameron Miles    MR#:  [id number]  DATE OF BIRTH:  1951-05-17  SUBJECTIVE:  wife at bedside. Patient on and off waking up and eating some breakfast. Received Ativan last night according to wife slept better after that. RN had asked about bladder scan and possible retention. Foley catheter was manipulated.   REVIEW OF SYSTEMS:   Review of Systems  Unable to perform ROS: Mental status change   patient on comfort care   DRUG ALLERGIES:   Allergies  Allergen Reactions  . Other Nausea And Vomiting    Anesthesia unknown class    VITALS:  Blood pressure 132/77, pulse 99, temperature 98.3 F (36.8 C), resp. rate 20, height 6\' 2"  (1.88 m), weight 73.6 kg, SpO2 97 %.  PHYSICAL EXAMINATION:   Physical Exam  GENERAL:  70 y.o.-year-old patient lying in the bed with no acute distress. Jaundiced, chronically ill HEENT: Head atraumatic, normocephalic. Marland Kitchen Icterus+ LUNGS: Normal breath sounds bilaterally, no wheezing, rales, rhonchi. No use of accessory muscles of respiration.  CARDIOVASCULAR: S1, S2 normal. No murmurs, rubs, or gallops.  ABDOMEN: Soft, nontender, distended. Bowel sounds present. No organomegaly or mass. Foley placed 01/02/21 NEUROLOGIC: grossly nonfocal. Deconditioned. Confused with intermittent alertness.  PSYCHIATRIC:  patient is lethargic    LABORATORY PANEL:  CBC Recent Labs  Lab 01/02/21 0620  WBC 6.6  HGB 9.8*  HCT 27.4*  PLT 78*    Chemistries  Recent Labs  Lab 01/02/21 0620  NA 129*  K 5.4*  CL 99  CO2 19*  GLUCOSE 114*  BUN 51*  CREATININE 1.45*  CALCIUM 8.8*  MG 2.3  AST 53*  ALT 36  ALKPHOS 87  BILITOT 6.2*   Cardiac Enzymes No results for input(s): TROPONINI in the last 168 hours. RADIOLOGY:  US PELVIS LIMITED (TRANSABDOMINAL ONLY)  Result Date: 01/04/2021 CLINICAL DATA:  Urinary retention. Foley catheter not emptying properly. EXAM: TRANSABDOMINAL ULTRASOUND OF PELVIS  TECHNIQUE: Transabdominal ultrasound examination of the pelvis was performed. COMPARISON:  Ultrasound abdomen 01/01/2021. FINDINGS: Foley catheter was initially noted be present in what appear to be a collapsed bladder. The catheter was clamped by nursing and bladder distended to confirm the presence of the Foley catheter within the bladder. Ascites present. IMPRESSION: Foley catheter noted in good anatomic position in the bladder. Ascites. Electronically Signed   By: Marcello Moores  Register   On: 01/04/2021 13:45   [place] ASCITES (ABDOMEN LIMITED)  Result Date: 01/03/2021 CLINICAL DATA:  70 year old male presents for possible paracentesis. There is a pending tunneled peritoneal drainage catheter on schedule EXAM: LIMITED ABDOMEN ULTRASOUND FOR ASCITES TECHNIQUE: Limited ultrasound survey for ascites was performed in all four abdominal quadrants. COMPARISON:  None. FINDINGS: Ultrasound images demonstrate moderate volume of ascites. After discussion with the primary team regarding the patient's upcoming tunneled peritoneal drainage catheter, we deferred a paracentesis at this time. IMPRESSION: Ultrasound demonstrates adequate ascites for upcoming safe placement of tunneled peritoneal drainage catheter Electronically Signed   By: Corrie Mckusick D.O.   On: 01/03/2021 14:54   ASSESSMENT AND PLAN:  Cameron Miles is a 70 y.o. male with medical history significant for untreated lymphoma, essential hypertension, newly diagnosed alcoholic cirrhosis in January 2022, former alcohol use disorder, recently diagnosed SBP on Bactrim from recent hospitalization at [facility] (from 12/02/2020 until 12/09/2020), had 3 paracentesis during that admission, who presented to [facility] ED due to confusion in the past 2 days.  Acute hepatic  encephalopathy likely secondary to noncompliance with lactulose Cirrhosis of liver--Alcoholic, End stage --Per his wife at bedside he does not always take his lactulose because he gets tired of having frequent  loose stools. --Presented with ammonia level 82 and confusion --CT head nonacute --Resume lactulose --Fall precautions, aspiration precautions, delirium precautions. --Meld score 29 associated with 19.6% estimated 73-month mortality --Palliative consult appreciated. Detailed discussion was earlier led by me and palliative care with wife. Requesting home with hospice. Wife understands patient's poor prognosis. Patient is DNR --Pleurax drain to be placed for comfort measures for recurrent malignant ascites. 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   AKI likely prerenal in the setting of poor oral intake with dehydration suspected hepatorenal --Avoid nephrotoxic agents  Abdominal ascites from liver cirrhosis -- abdominal ultrasound--shows moderate ascites. Pt will get Pleurax on friday  Hypovolemic hyponatremia due to cirrhosis Presented with serum sodium 128 Encourage oral intake if able No reported nausea or vomiting  Chronic thrombocytopenia in the setting of ESLD Platelet count 99  Coagulopathy due to liver disease INR 1.7  Essential hypertension BP is not at goal, elevated. IV antihypertensives as needed with parameters  History of SBP --Per his wife at bedside patient was recently admitted at [facility] and was diagnosed with spontaneous bacterial peritonitis, was discharged on Bactrim -On cipro  History of untreated lymphoma Will need to follow-up  with his oncologist outpatient.  Generalized weakness/failure to thrive/severe malnutrition Nutrition Status: Nutrition Problem: Severe Malnutrition Etiology: chronic illness (lymphoma, etoh abuse, cirrhosis) Signs/Symptoms: severe fat depletion,severe muscle depletion  PT OT to assess--recommends rehab however given patient's current condition not sure how much active participation he'll be able to do.  TOC for discharge planning  Palliative care to see patient today  DVT prophylaxis: SCDs. Code Status: DNR Family Communication:  wife at bedside Disposition Plan:TBD Consults called: GI. Level of care: Med-Surg Status is: Inpatient  Remains inpatient appropriate because:Inpatient level of care appropriate due to severity of illness Medically stable for discharge: No     TOTAL TIME TAKING CARE OF THIS PATIENT: *25 minutes.  >50% time spent on counselling and coordination of care  Note: This dictation was prepared with Dragon dictation along with smaller phrase technology. Any transcriptional errors that result from this process are unintentional.  Fritzi Mandes M.D    [facility]   CC: Primary care physician; Rogers Blocker, MDPatient ID: Cameron Miles, male   DOB: 09/08/1950, 70 y.o.   MRN: [id number]

## 2021-01-04 NOTE — TOC Initial Note (Signed)
Transition of Care [facility]) - Initial/Assessment Note    Patient Details  Name: Cameron Miles MRN: [id number] Date of Birth: November 18, 1950  [facility] CM/SW Contact:    Candie Chroman, LCSW Phone Number: 01/04/2021, 9:37 AM  Clinical Narrative:  Patient not fully oriented. CSW met with patient and wife at bedside. She confirmed plan to return home at discharge and confirmed that DME had already been arranged through [facility]: Hospital bed and overbed table. She also thinks a bedside commode will be beneficial. Zandra Abts, RN with [facility] is aware. Patient will need EMS transport home. Address on facesheet is correct. Per MD, plan to get pleurex in the morning. Potential discharge after if stable. No further concerns. CSW encouraged patient's wife to contact CSW as needed. CSW will continue to follow patient and his wife for support and facilitate return home when stable.                Expected Discharge Plan: [place] Barriers to Discharge: Continued Medical Work up   Patient Goals and CMS Choice     Choice offered to / list presented to : Patient  Expected Discharge Plan and Services Expected Discharge Plan: [place] Acute Care Choice: Durable Medical Equipment,Hospice Living arrangements for the past 2 months: Single Family Home                 DME Arranged: Overbed table,Hospital bed,Bedside commode DME Agency: Other - Comment (Hospice will order)                  Prior Living Arrangements/Services Living arrangements for the past 2 months: Single Family Home Lives with:: Spouse Patient language and need for interpreter reviewed:: Yes Do you feel safe going back to the place where you live?: Yes      Need for Family Participation in Patient Care: Yes (Comment) Care giver support system in place?: Yes (comment)   Criminal Activity/Legal Involvement Pertinent to Current Situation/Hospitalization: No -  Comment as needed  Activities of Daily Living   ADL Screening (condition at time of admission) Is the patient deaf or have difficulty hearing?: No Does the patient have difficulty concentrating, remembering, or making decisions?: Yes Does the patient have difficulty dressing or bathing?: Yes Does the patient have difficulty walking or climbing stairs?: Yes  Permission Sought/Granted Permission sought to share information with : Facility Contact Representative,Family Supports    Share Information with NAME: Traylen Eckels  Permission granted to share info w AGENCY: [facility] granted to share info w Relationship: Wife  Permission granted to share info w Contact Information: [phone]  Emotional Assessment Appearance:: Appears stated age Attitude/Demeanor/Rapport: Unable to Assess Affect (typically observed): Unable to Assess Orientation: : Oriented to Self Alcohol / Substance Use: Not Applicable Psych Involvement: No (comment)  Admission diagnosis:  Hepatic encephalopathy ([place]) [K72.90] Jaundice [R17] AKI (acute kidney injury) ([place]) [N17.9] Acute hepatic encephalopathy [K72.00] Altered mental status, unspecified altered mental status type [R41.82] Patient Active Problem List   Diagnosis Date Noted  . Protein-calorie malnutrition, severe 01/02/2021  . AKI (acute kidney injury) ([place])   . Jaundice   . Alcoholic cirrhosis of liver with ascites ([place])   . Hepatic encephalopathy ([place]) 01/01/2021  . Thrombocytopathia ([place]) 11/02/2019  . Transaminitis 11/02/2019  . 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 Cough 10/26/2018  . Malignant lymphoma, low grade ([place]) 05/13/2018  . Sleeping difficulty 04/01/2018  .  Cervical lymphadenopathy 04/01/2018  . Allergic rhinitis 10/02/2017  . Elevated LFTs 10/02/2017  . Ear fullness, right 06/30/2017  . Family history of prostate cancer 04/18/2017  . Phimosis 04/18/2017  . Erectile dysfunction due to arterial insufficiency  04/18/2017  . Enlarged prostate on rectal examination 04/18/2017  . Hypertension 03/27/2017  . Effusion of left olecranon bursa 03/27/2017  . Vision changes 03/27/2017  . Pure hypercholesterolemia 11/16/2015   PCP:  Rogers Blocker, MD Pharmacy:   [facility], [place] [address] Phone: [phone] Fax: [phone]     Social Determinants of Health (SDOH) Interventions    Readmission Risk Interventions No flowsheet data found.

## 2021-01-04 NOTE — Care Management Important Message (Signed)
Important Message  Patient Details  Name: Cameron Miles MRN: [id number] Date of Birth: [birth year]   Medicare Important Message Given:  Other (see comment)  Discharging with hospice services.  Medicare IM withheld at this time out of respect for patient and family.    Dannette Barbara 01/04/2021, 2:03 PM

## 2021-01-04 NOTE — Progress Notes (Signed)
[facility] Hospital Liaison RN note:  Visited with patient at bedside. Spoke with spouse, Jenny Reichmann over the phone, to initiate education related to hospice philosophy, services and to answer any questions. She verbalized understanding. Plan is for patient to have a pleurex placed tomorrow and discharge home via EMS. Hospital care team is aware. Patient has an admission visit for hospice on Saturday at 10am. Per spouse, Hospital bed and OBT has been delivered.  Please send signed and completed DNR home with patient. Please provide prescriptions at discharge as needed to ensure ongoing symptom management.  Please call with any hospice related questions or concerns.  Thank you for the opportunity to participate in this patient's care.  Zandra Abts, RN [facility] Liaison [phone]

## 2021-01-04 NOTE — Progress Notes (Signed)
[name], MD [address] [place], [place], [place], [address] [address], [place], [place], [place], [address] Phone: [phone]  Fax: [phone]   Subjective: Patient more conversive today.  Wife at bedside.  Confusion improving.   Objective: Exam: Vital signs in last 24 hours: Vitals:   01/03/21 1609 01/03/21 1954 01/04/21 0822 01/04/21 1516  BP: 118/67 (!) 98/46 132/77 (!) 141/76  Pulse: (!) 103 97 99 100  Resp: 18 16 20 18   Temp: 97.8 F (36.6 C) 98 F (36.7 C) 98.3 F (36.8 C) 97.6 F (36.4 C)  TempSrc: Oral Oral  Oral  SpO2: 96% 97% 97% 95%  Weight:      Height:       Weight change:   Intake/Output Summary (Last 24 hours) at 01/04/2021 1637 Last data filed at 01/04/2021 1355 Gross per 24 hour  Intake 300 ml  Output 200 ml  Net 100 ml    General: No acute distress, AAO x3 Abd: Soft, distended abdomen, nontender, No HSM Skin: Warm, no rashes Neck: Supple, Trachea midline   Lab Results: Lab Results  Component Value Date   WBC 6.6 01/02/2021   HGB 9.8 (L) 01/02/2021   HCT 27.4 (L) 01/02/2021   MCV 103.8 (H) 01/02/2021   PLT 78 (L) 01/02/2021   Micro Results: Recent Results (from the past 240 hour(s))  Resp Panel by RT-PCR (Flu A&B, Covid) Nasopharyngeal Swab     Status: None   Collection Time: 01/01/21  3:53 PM   Specimen: Nasopharyngeal Swab; Nasopharyngeal(NP) swabs in vial transport medium  Result Value Ref Range Status   SARS Coronavirus 2 by RT PCR NEGATIVE NEGATIVE Final    Comment: (NOTE) SARS-CoV-2 target nucleic acids are NOT DETECTED.  The SARS-CoV-2 RNA is generally detectable in upper respiratory specimens during the acute phase of infection. The lowest concentration of SARS-CoV-2 viral copies this assay can detect is 138 copies/mL. A negative result does not preclude SARS-Cov-2 infection and should not be used as the sole basis for treatment or other patient management decisions. A negative result may occur with   improper specimen collection/handling, submission of specimen other than nasopharyngeal swab, presence of viral mutation(s) within the areas targeted by this assay, and inadequate number of viral copies(<138 copies/mL). A negative result must be combined with clinical observations, patient history, and epidemiological information. The expected result is Negative.  Fact Sheet for Patients:  EntrepreneurPulse.com.au  Fact Sheet for Healthcare Providers:  IncredibleEmployment.be  This test is no t yet approved or cleared by the [place] FDA and  has been authorized for detection and/or diagnosis of SARS-CoV-2 by FDA under an Emergency Use Authorization (EUA). This EUA will remain  in effect (meaning this test can be used) for the duration of the COVID-19 declaration under Section 564(b)(1) of the Act, 21 U.S.C.section 360bbb-3(b)(1), unless the authorization is terminated  or revoked sooner.       Influenza A by PCR NEGATIVE NEGATIVE Final   Influenza B by PCR NEGATIVE NEGATIVE Final    Comment: (NOTE) The Xpert Xpress SARS-CoV-2/FLU/RSV plus assay is intended as an aid in the diagnosis of influenza from Nasopharyngeal swab specimens and should not be used as a sole basis for treatment. Nasal washings and aspirates are unacceptable for Xpert Xpress SARS-CoV-2/FLU/RSV testing.  Fact Sheet for Patients: EntrepreneurPulse.com.au  Fact Sheet for Healthcare Providers: IncredibleEmployment.be  This test is not yet approved or cleared by the [place] FDA and has been authorized for detection and/or diagnosis of  SARS-CoV-2 by FDA under an Emergency Use Authorization (EUA). This EUA will remain in effect (meaning this test can be used) for the duration of the COVID-19 declaration under Section 564(b)(1) of the Act, 21 U.S.C. section 360bbb-3(b)(1), unless the authorization is terminated  or revoked.  Performed at [facility], [address], [place], [place] [address]   Urine Culture     Status: None   Collection Time: 01/01/21  6:03 PM   Specimen: Urine, Random  Result Value Ref Range Status   Specimen Description   Final    URINE, RANDOM Performed at [facility], [address], [place], [place] [address]    Special Requests   Final    NONE Performed at [facility], [address], [place], [place] [address]    Culture   Final    NO GROWTH Performed at [facility], [place] [address], [place], [place] [address]    Report Status 01/03/2021 FINAL  Final  CULTURE, BLOOD (ROUTINE X 2) w Reflex to ID Panel     Status: None (Preliminary result)   Collection Time: 01/01/21 11:23 PM   Specimen: BLOOD  Result Value Ref Range Status   Specimen Description BLOOD BLOOD LEFT HAND  Final   Special Requests   Final    BOTTLES DRAWN AEROBIC AND ANAEROBIC Blood Culture adequate volume   Culture   Final    NO GROWTH 2 DAYS Performed at [facility], [address] [place],  [address]    Report Status PENDING  Incomplete  CULTURE, BLOOD (ROUTINE X 2) w Reflex to ID Panel     Status: None (Preliminary result)   Collection Time: 01/01/21 11:24 PM   Specimen: BLOOD  Result Value Ref Range Status   Specimen Description BLOOD BLOOD RIGHT HAND  Final   Special Requests   Final    BOTTLES DRAWN AEROBIC AND ANAEROBIC Blood Culture adequate volume   Culture   Final    NO GROWTH 2 DAYS Performed at [facility], [address], [place],  [address]    Report Status PENDING  Incomplete   Studies/Results: US PELVIS LIMITED (TRANSABDOMINAL ONLY)  Result Date: 01/04/2021 CLINICAL DATA:  Urinary retention. Foley catheter not emptying properly. EXAM: TRANSABDOMINAL ULTRASOUND OF PELVIS TECHNIQUE: Transabdominal ultrasound examination of the pelvis was performed. COMPARISON:  Ultrasound abdomen 01/01/2021.  FINDINGS: Foley catheter was initially noted be present in what appear to be a collapsed bladder. The catheter was clamped by nursing and bladder distended to confirm the presence of the Foley catheter within the bladder. Ascites present. IMPRESSION: Foley catheter noted in good anatomic position in the bladder. Ascites. Electronically Signed   By: Marcello Moores  Register   On: 01/04/2021 13:45   [place] ASCITES (ABDOMEN LIMITED)  Result Date: 01/03/2021 CLINICAL DATA:  70 year old male presents for possible paracentesis. There is a pending tunneled peritoneal drainage catheter on schedule EXAM: LIMITED ABDOMEN ULTRASOUND FOR ASCITES TECHNIQUE: Limited ultrasound survey for ascites was performed in all four abdominal quadrants. COMPARISON:  None. FINDINGS: Ultrasound images demonstrate moderate volume of ascites. After discussion with the primary team regarding the patient's upcoming tunneled peritoneal drainage catheter, we deferred a paracentesis at this time. IMPRESSION: Ultrasound demonstrates adequate ascites for upcoming safe placement of tunneled peritoneal drainage catheter Electronically Signed   By: Corrie Mckusick D.O.   On: 01/03/2021 14:54   Medications:  Scheduled Meds: . Chlorhexidine Gluconate Cloth  6 each Topical Daily  . ciprofloxacin  500 mg  Oral Daily  . feeding supplement  237 mL Oral TID BM   Continuous Infusions: . [START ON 01/05/2021]  ceFAZolin (ANCEF) IV     PRN Meds:.LORazepam, morphine CONCENTRATE, ondansetron (ZOFRAN) IV   Assessment: Active Problems:   Hepatic encephalopathy (HCC)   Protein-calorie malnutrition, severe   AKI (acute kidney injury) ([place])   Jaundice   Alcoholic cirrhosis of liver with ascites ([place])    Plan: Patient awaiting Pleurx catheter placement and going home with hospice tomorrow  Confusion is improving with lactulose  Patient is on SBP prophylaxis given recent SBP at [facility]  Repeat liver enzymes not available since patient going home with  hospice   LOS: 3 days   [name], MD 01/04/2021, 4:37 PM

## 2021-01-05 ENCOUNTER — Inpatient Hospital Stay: Payer: Medicare PPO

## 2021-01-05 DIAGNOSIS — K7031 Alcoholic cirrhosis of liver with ascites: Secondary | ICD-10-CM | POA: Diagnosis not present

## 2021-01-05 DIAGNOSIS — Z515 Encounter for palliative care: Secondary | ICD-10-CM

## 2021-01-05 HISTORY — PX: IR PERC PLEURAL DRAIN W/INDWELL CATH W/IMG GUIDE: IMG5383

## 2021-01-05 MED ORDER — MORPHINE SULFATE (CONCENTRATE) 10 MG/0.5ML PO SOLN: 5.0000 mg | ORAL | 0 refills | Status: AC | PRN

## 2021-01-05 MED ORDER — LORAZEPAM 0.5 MG PO TABS: 0.5000 mg | ORAL_TABLET | Freq: Three times a day (TID) | ORAL | 0 refills | Status: AC | PRN

## 2021-01-05 MED ORDER — MIDAZOLAM HCL 2 MG/2ML IJ SOLN
INTRAMUSCULAR | Status: AC
  Filled 2021-01-05: qty 2

## 2021-01-05 MED ORDER — FENTANYL CITRATE (PF) 100 MCG/2ML IJ SOLN
INTRAMUSCULAR | Status: AC
  Filled 2021-01-05: qty 2

## 2021-01-05 MED ORDER — CIPROFLOXACIN HCL 500 MG PO TABS: 500.0000 mg | ORAL_TABLET | Freq: Every day | ORAL | 0 refills | Status: AC

## 2021-01-05 MED ORDER — SODIUM CHLORIDE 0.9 % IV SOLN: INTRAVENOUS | Status: DC

## 2021-01-05 MED ORDER — FENTANYL CITRATE (PF) 100 MCG/2ML IJ SOLN
INTRAMUSCULAR | Status: AC | PRN
  Administered 2021-01-05: 25 ug via INTRAVENOUS

## 2021-01-05 NOTE — Progress Notes (Signed)
[facility]) Hospital Liaison RN note:  Call to spouse Jenny Reichmann. LVM. DME has been delivered to home and Hospice admission visit scheduled for 10 am tomorrow. Hospital care team is aware. [facility] Liaison will continue to follow for disposition.  Please call with any hospice related questions or concerns.  Thank you for the opportunity to participate in this patient's care.  Zandra Abts, RN [facility] Liaison  [phone]

## 2021-01-05 NOTE — Progress Notes (Signed)
Pt discharged home with his wife.  Hospice care to start tomorrow am. Discharge instructions reviewed with pt's wife and questions answered.  Pt without complaints and VSS. Pt discharged via stretcher per EMS.

## 2021-01-05 NOTE — Consult Note (Signed)
Chief Complaint: Recurrent ascites  Referring Physician(s): Dr. Posey Pronto  Supervising Physician: Corrie Mckusick  Patient Status: [facility] - In-pt  History of Present Illness: Cameron Miles is a 70 y.o. male referred to VIR service for evaluation of tunneled peritoneal catheter.   Cameron Miles has recurrent ascites, secondary to portal HTN/cirrhosis.   His wife tells me that he has been hospitalized at [facility] recently, and has received care there previously for multiple large volume paracentesis.  His last admission within the last few weeks requried one LVP of 12L, and then 4-5 days later 9L.    The family tells me that they have hospice initiated, and that they are making plans for end of life/comfort care.  They would like to have a tunneled peritoneal catheter for hospice at home.     Past Medical History:  Diagnosis Date  . Allergy   . Chicken pox   . Hyperlipidemia   . Hypertension   . Kidney stones   . 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 LITHOTRIPSY  1985  . LYMPH NODE BIOPSY      Allergies: Other  Medications: Prior to Admission medications   Medication Sig Start Date End Date Taking? Authorizing Provider  albuterol (VENTOLIN HFA) 108 (90 Base) MCG/ACT inhaler Inhale 1 puff into the lungs every 6 (six) hours as needed for shortness of breath. 12/15/20  Yes [provider]  aspirin EC 81 MG tablet Take 81 mg by mouth daily.   Yes [provider]  furosemide (LASIX) 40 MG tablet Take 40 mg by mouth daily. 11/21/20  Yes [provider]  hydrOXYzine (ATARAX/VISTARIL) 10 MG tablet Take 10 mg by mouth at bedtime. 12/14/20  Yes [provider]  lactulose (CHRONULAC) 10 GM/15ML solution Take 45 mLs by mouth 3 (three) times daily. 12/11/20  Yes [provider]  megestrol (MEGACE) 400 MG/10ML suspension Take by mouth daily.   Yes [provider]  Multiple Vitamin (MULTIVITAMIN WITH  MINERALS) TABS tablet Take 1 tablet by mouth daily.   Yes [provider]  spironolactone (ALDACTONE) 50 MG tablet Take 50 mg by mouth daily. 11/07/20  Yes [provider]  sulfamethoxazole-trimethoprim (BACTRIM DS) 800-160 MG tablet Take 1 tablet by mouth daily. 12/11/20  Yes [provider]     Family History  Problem Relation Age of Onset  . Stroke Mother   . Prostate cancer Brother   . Hyperlipidemia Brother   . Heart disease Brother   . Stroke Brother   . Kidney disease Brother   . Mental illness Brother   . Melanoma Brother   . Bladder Cancer Neg Hx   . Kidney cancer Neg Hx     Social History   Socioeconomic History  . Marital status: Married    Spouse name: Not on file  . Number of children: Not on file  . Years of education: Not on file  . Highest education level: Not on file  Occupational History  . Not on file  Tobacco Use  . Smoking status: Former Smoker    Quit date: 08/08/1969    Years since quitting: 51.4  . Smokeless tobacco: Never Used  Vaping Use  . Vaping Use: Never used  Substance and Sexual Activity  . Alcohol use: Yes    Alcohol/week: 7.0 standard drinks    Types: 7 Standard drinks or equivalent per week  . Drug use: No  . Sexual activity: Yes  Other  Topics Concern  . Not on file  Social History Narrative   Retd. Mental health professional/case manager; remote hx of smoking; couple of wines at night. Lives in  [place].    Social Determinants of Health   Financial Resource Strain: Not on file  Food Insecurity: Not on file  Transportation Needs: Not on file  Physical Activity: Not on file  Stress: Not on file  Social Connections: Not on file       Review of Systems: A 12 point ROS discussed and pertinent positives are indicated in the HPI above.  All other systems are negative.  Review of Systems  Vital Signs: BP 129/66   Pulse (!) 103   Temp 97.8 F (36.6 C) (Oral)   Resp 14   Ht 6\' 2"  (1.88 m)   Wt  73.6 kg   SpO2 92%   BMI 20.83 kg/m   Physical Exam General: 70 yo male, somnolent, but answering questions.    HEENT: Atraumatic, normocephalic.  Conjugate gaze, extra-ocular motor intact. No scleral icterus or scleral injection. No lesions on external ears, nose, lips, or gums.  Leukoplakia and tacky mucosa.   Neck: Symmetric with no goiter enlargement.  Chest/Lungs:  Symmetric chest with inspiration/expiration.  No labored breathing.  Clear to auscultation with no wheezes, rhonchi, or rales.  Heart:  RRR, with no third heart sounds appreciated. No JVD appreciated.  Abdomen:  Soft, NT/ND, with + bowel sounds.  Fluid wave.   Genito-urinary: Deferred  .     Imaging: CT Head Wo Contrast  Result Date: 01/01/2021 CLINICAL DATA:  Mental status change. Weakness. Abdominal swelling. Cirrhosis. No reported injury. EXAM: CT HEAD WITHOUT CONTRAST TECHNIQUE: Contiguous axial images were obtained from the base of the skull through the vertex without intravenous contrast. COMPARISON:  None. FINDINGS: Brain: Nonspecific moderate subcortical and periventricular white matter hypodensity, most in keeping with chronic small vessel ischemic change. No evidence of parenchymal hemorrhage or extra-axial fluid collection. No mass lesion, mass effect, or midline shift. No CT evidence of acute infarction. Cerebral volume is age appropriate. No ventriculomegaly. Vascular: No acute abnormality. Skull: No evidence of calvarial fracture. Sinuses/Orbits: The visualized paranasal sinuses are essentially clear. Other:  The mastoid air cells are unopacified. IMPRESSION: 1. No evidence of acute intracranial abnormality. 2. Moderate chronic small vessel ischemic changes in the cerebral white matter. Electronically Signed   By: Ilona Sorrel M.D.   On: 01/01/2021 16:57   US Abdomen Complete  Result Date: 01/01/2021 CLINICAL DATA:  Jaundice, acute kidney injury EXAM: ABDOMEN ULTRASOUND COMPLETE COMPARISON:  11/26/2017 abdominal  sonogram FINDINGS: Gallbladder: No gallstones or wall thickening visualized. No sonographic Murphy sign noted by sonographer. Common bile duct: Diameter: 4 mm Liver: Diffusely nodular liver contour with coarsened liver parenchymal echotexture, compatible with cirrhosis. No liver masses detected. Portal vein is patent on color Doppler imaging with normal direction of blood flow towards the liver. IVC: No abnormality visualized. Pancreas: Visualized portion unremarkable. Spleen: Size and appearance within normal limits. Right Kidney: Length: 10.4 cm. Echogenicity within normal limits. No mass or hydronephrosis visualized. Left Kidney: Length: 10.1 cm. Echogenicity within normal limits. No mass or hydronephrosis visualized. Abdominal aorta: Not visualized due to overlying bowel gas. Other findings: Moderate volume ascites, most prominent in the perihepatic and right abdomen. IMPRESSION: 1. Morphologic changes of cirrhosis.  No liver masses detected. 2. Moderate volume ascites, most prominent in the right abdomen. Electronically Signed   By: Ilona Sorrel M.D.   On: 01/01/2021 19:14  US PELVIS LIMITED (TRANSABDOMINAL ONLY)  Result Date: 01/04/2021 CLINICAL DATA:  Urinary retention. Foley catheter not emptying properly. EXAM: TRANSABDOMINAL ULTRASOUND OF PELVIS TECHNIQUE: Transabdominal ultrasound examination of the pelvis was performed. COMPARISON:  Ultrasound abdomen 01/01/2021. FINDINGS: Foley catheter was initially noted be present in what appear to be a collapsed bladder. The catheter was clamped by nursing and bladder distended to confirm the presence of the Foley catheter within the bladder. Ascites present. IMPRESSION: Foley catheter noted in good anatomic position in the bladder. Ascites. Electronically Signed   By: Marcello Moores  Register   On: 01/04/2021 13:45   [place] ASCITES (ABDOMEN LIMITED)  Result Date: 01/03/2021 CLINICAL DATA:  70 year old male presents for possible paracentesis. There is a pending tunneled  peritoneal drainage catheter on schedule EXAM: LIMITED ABDOMEN ULTRASOUND FOR ASCITES TECHNIQUE: Limited ultrasound survey for ascites was performed in all four abdominal quadrants. COMPARISON:  None. FINDINGS: Ultrasound images demonstrate moderate volume of ascites. After discussion with the primary team regarding the patient's upcoming tunneled peritoneal drainage catheter, we deferred a paracentesis at this time. IMPRESSION: Ultrasound demonstrates adequate ascites for upcoming safe placement of tunneled peritoneal drainage catheter Electronically Signed   By: Corrie Mckusick D.O.   On: 01/03/2021 14:54    Labs:  CBC: Recent Labs    01/01/21 1452 01/02/21 0620  WBC 7.4 6.6  HGB 9.6* 9.8*  HCT 27.5* 27.4*  PLT 99* 78*    COAGS: Recent Labs    01/01/21 1452 01/02/21 0620  INR 1.7* 1.8*    BMP: Recent Labs    01/01/21 1452 01/02/21 0620  NA 128* 129*  K 5.0 5.4*  CL 97* 99  CO2 21* 19*  GLUCOSE 134* 114*  BUN 52* 51*  CALCIUM 9.1 8.8*  CREATININE 1.83* 1.45*  GFRNONAA 39* 52*    LIVER FUNCTION TESTS: Recent Labs    01/01/21 1452 01/02/21 0620  BILITOT 5.5* 6.2*  AST 51* 53*  ALT 38 36  ALKPHOS 101 87  PROT 6.2* 5.6*  ALBUMIN 3.5 3.0*    TUMOR MARKERS: No results for input(s): AFPTM, CEA, CA199, CHROMGRNA in the last 8760 hours.  Assessment and Plan:  Cameron Miles is 70 yo male with end stage liver disease/portal HTN, with recurrent ascites.    Tunneled peritoneal catheter is reasonable given the goals of therapy, which are palliative at this time, with home hospice set up.   Risks and benefits discussed with the patient's wife, including bleeding, infection, damage to adjacent structures, bowel perforation/fistula connection, and sepsis.  All of the patient's questions were answered, patient is agreeable to proceed. Consent signed and in chart.   Plan for tunneled peritoneal catheter.   Electronically Signed: Corrie Mckusick, DO 01/05/2021, 9:35 AM   I  spent a total of 40 Minutes    in face to face in clinical consultation, greater than 50% of which was counseling/coordinating care for tunneled peritoneal catheter.

## 2021-01-05 NOTE — Plan of Care (Signed)
PMT note:  Patient had paracentesis yesterday but unable to have drain placement until tomorrow. Discussed with attending team, patient is comfortable. Home hospice agency is following for D/C.   No charge.

## 2021-01-05 NOTE — Procedures (Signed)
Interventional Radiology Procedure Note  Procedure: Image guided drain placement, tunneled cuffed peritoneal catheter.     Complications: None  EBL: None    Recommendations: - Routine wound care   - education for drain care  Signed,  Dulcy Fanny. Earleen Newport, DO

## 2021-01-05 NOTE — Discharge Summary (Signed)
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 Allergy   . Chicken pox   . Hyperlipidemia   . Hypertension   . Kidney stones   . Lymphoma ([place]) 05/2018    HOSPITAL COURSE:   Acute hepatic encephalopathy likely secondary to noncompliance with lactulose Cirrhosis of liver--Alcoholic, End stage --Per his wife at bedside he does not always take his lactulose because he gets tired of having frequent loose stools. --Presented with ammonia level 82 and confusion --CT head nonacute --Resume lactulose --Fall precautions, aspiration precautions, delirium precautions. --Meld score 29 associated with 19.6% estimated 9-month mortality --Palliative consult appreciated. Detailed discussion was earlier led by me and palliative care with wife. Requesting home with hospice. Wife understands patient's poor prognosis. Patient is DNR --Pleurax drain placed for comfort measures for recurrent malignant ascites. On 01/05/2021 -- cont Cipro for SBP prophylaxis per wifes request  Presumptive UTI, POA Likely contributing to his encephalopathy Presented with UA positive for pyuria but rare bacteria Cont Cipro  Acute urinary retention -- Continue Flomax -- will continue Foley since patient is now under hospice care  -- Foley placement checked with ultrasound pelvis.  End-stage liver disease in  the setting of alcoholic cirrhosis -GIconsult--nothing to offer --Meld score 29 ----Per his wife he [employer] he isnot a candidate for liver transplantbecause of his lymphoma.   AKI likely prerenal in the setting of poor oral intake with dehydration suspected hepatorenal --Avoid nephrotoxic agents  Macrocytic anemia  Hypovolemic hyponatremia due to cirrhosis  Chronic thrombocytopenia in the setting of ESLD Platelet count 99  Coagulopathy due to liver disease INR 1.7  History of SBP Per his wife at bedside patient was recently [facility] and was diagnosed with spontaneous bacterial peritonitis, was discharged on Bactrim -On cipro  History of untreated lymphoma  Generalized weakness/failure to thrive/severe malnutrition Nutrition Status: Nutrition Problem: Severe Malnutrition Etiology: chronic illness (lymphoma, etoh abuse, cirrhosis) Signs/Symptoms: severe fat depletion,severe muscle depletion   Palliative care input appreciated. Patient will discharged to home with hospice.  DVT prophylaxis:SCDs.  Code Status:DNR Family Communication: wife at bedside Disposition Plan: home with hospice Consults called:GI. Level of care: Med-Surg Status is: Inpatient                 CONSULTS OBTAINED:  Treatment Team:  Virgel Manifold, MD  DRUG ALLERGIES:   Allergies  Allergen Reactions  . Other Nausea And Vomiting    Anesthesia unknown class    DISCHARGE MEDICATIONS:   Allergies as of 01/05/2021      Reactions   Other Nausea And Vomiting   Anesthesia unknown class      Medication List    STOP taking these medications   aspirin EC 81 MG tablet   furosemide 40 MG tablet Commonly known as: LASIX   hydrOXYzine 10 MG tablet Commonly known as: ATARAX/VISTARIL   lactulose 10 GM/15ML solution Commonly known as: CHRONULAC  megestrol 400 MG/10ML suspension Commonly known as: MEGACE   multivitamin with minerals  Tabs tablet   spironolactone 50 MG tablet Commonly known as: ALDACTONE   sulfamethoxazole-trimethoprim 800-160 MG tablet Commonly known as: BACTRIM DS     TAKE these medications   albuterol 108 (90 Base) MCG/ACT inhaler Commonly known as: VENTOLIN HFA Inhale 1 puff into the lungs every 6 (six) hours as needed for shortness of breath.   ciprofloxacin 500 MG tablet Commonly known as: CIPRO Take 1 tablet (500 mg total) by mouth daily. Start taking on: Jan 06, 2021   LORazepam 0.5 MG tablet Commonly known as: Ativan Take 1 tablet (0.5 mg total) by mouth every 8 (eight) hours as needed for anxiety.   morphine CONCENTRATE 10 MG/0.5ML Soln concentrated solution Take 0.25 mLs (5 mg total) by mouth every hour as needed for severe pain, moderate pain or shortness of breath.       If you experience worsening of your admission symptoms, develop shortness of breath, life threatening emergency, suicidal or homicidal thoughts you must seek medical attention immediately by calling 911 or calling your MD immediately  if symptoms less severe.  You Must read complete instructions/literature along with all the possible adverse reactions/side effects for all the Medicines you take and that have been prescribed to you. Take any new Medicines after you have completely understood and accept all the possible adverse reactions/side effects.   Please note  You were cared for by a hospitalist during your hospital stay. If you have any questions about your discharge medications or the care you received while you were in the hospital after you are discharged, you can call the unit and asked to speak with the hospitalist on call if the hospitalist that took care of you is not available. Once you are discharged, your primary care physician will handle any further medical issues. Please note that NO REFILLS for any discharge medications will be authorized once you are discharged, as it is imperative that you return  to your primary care physician (or establish a relationship with a primary care physician if you do not have one) for your aftercare needs so that they can reassess your need for medications and monitor your lab values. Today   SUBJECTIVE   Intermittently awakens. Does not talk much. Wife in the room.  VITAL SIGNS:  Blood pressure 122/63, pulse (!) 102, temperature 97.8 F (36.6 C), temperature source Oral, resp. rate 14, height 6\' 2"  (1.88 m), weight 73.6 kg, SpO2 96 %.  I/O:    Intake/Output Summary (Last 24 hours) at 01/05/2021 1312 Last data filed at 01/05/2021 1115 Gross per 24 hour  Intake 0 ml  Output 8295 ml  Net -8295 ml    PHYSICAL EXAMINATION:  GENERAL:  70 y.o.-year-old patient lying in the bed with no acute distress. Jaundiced, chronically ill HEENT: Head atraumatic, normocephalic. Marland Kitchen Icterus+ LUNGS: Normal breath sounds bilaterally, no wheezing, rales, rhonchi. No use of accessory muscles of respiration.  CARDIOVASCULAR: S1, S2 normal. No murmurs, rubs, or gallops.  ABDOMEN: Soft, nontender, distended. Bowel sounds present. No organomegaly or mass. Foley placed 01/02/21 NEUROLOGIC: grossly nonfocal. Deconditioned. Confused with intermittent alertness.  PSYCHIATRIC:  patient is lethargic   DATA REVIEW:   CBC  Recent Labs  Lab 01/02/21 0620  WBC 6.6  HGB 9.8*  HCT 27.4*  PLT 78*    Chemistries  Recent Labs  Lab 01/02/21 0620  NA 129*  K 5.4*  CL 99  CO2 19*  GLUCOSE  114*  BUN 51*  CREATININE 1.45*  CALCIUM 8.8*  MG 2.3  AST 53*  ALT 36  ALKPHOS 87  BILITOT 6.2*    Microbiology Results   Recent Results (from the past 240 hour(s))  Resp Panel by RT-PCR (Flu A&B, Covid) Nasopharyngeal Swab     Status: None   Collection Time: 01/01/21  3:53 PM   Specimen: Nasopharyngeal Swab; Nasopharyngeal(NP) swabs in vial transport medium  Result Value Ref Range Status   SARS Coronavirus 2 by RT PCR NEGATIVE NEGATIVE Final    Comment: (NOTE) SARS-CoV-2 target  nucleic acids are NOT DETECTED.  The SARS-CoV-2 RNA is generally detectable in upper respiratory specimens during the acute phase of infection. The lowest concentration of SARS-CoV-2 viral copies this assay can detect is 138 copies/mL. A negative result does not preclude SARS-Cov-2 infection and should not be used as the sole basis for treatment or other patient management decisions. A negative result may occur with  improper specimen collection/handling, submission of specimen other than nasopharyngeal swab, presence of viral mutation(s) within the areas targeted by this assay, and inadequate number of viral copies(<138 copies/mL). A negative result must be combined with clinical observations, patient history, and epidemiological information. The expected result is Negative.  Fact Sheet for Patients:  EntrepreneurPulse.com.au  Fact Sheet for Healthcare Providers:  IncredibleEmployment.be  This test is no t yet approved or cleared by the [place] FDA and  has been authorized for detection and/or diagnosis of SARS-CoV-2 by FDA under an Emergency Use Authorization (EUA). This EUA will remain  in effect (meaning this test can be used) for the duration of the COVID-19 declaration under Section 564(b)(1) of the Act, 21 U.S.C.section 360bbb-3(b)(1), unless the authorization is terminated  or revoked sooner.       Influenza A by PCR NEGATIVE NEGATIVE Final   Influenza B by PCR NEGATIVE NEGATIVE Final    Comment: (NOTE) The Xpert Xpress SARS-CoV-2/FLU/RSV plus assay is intended as an aid in the diagnosis of influenza from Nasopharyngeal swab specimens and should not be used as a sole basis for treatment. Nasal washings and aspirates are unacceptable for Xpert Xpress SARS-CoV-2/FLU/RSV testing.  Fact Sheet for Patients: EntrepreneurPulse.com.au  Fact Sheet for Healthcare  Providers: IncredibleEmployment.be  This test is not yet approved or cleared by the [place] FDA and has been authorized for detection and/or diagnosis of SARS-CoV-2 by FDA under an Emergency Use Authorization (EUA). This EUA will remain in effect (meaning this test can be used) for the duration of the COVID-19 declaration under Section 564(b)(1) of the Act, 21 U.S.C. section 360bbb-3(b)(1), unless the authorization is terminated or revoked.  Performed at [facility], [address] [place], [place] [address]   Urine Culture     Status: None   Collection Time: 01/01/21  6:03 PM   Specimen: Urine, Random  Result Value Ref Range Status   Specimen Description   Final    URINE, RANDOM Performed at [facility], [address], [place], [place] [address]    Special Requests   Final    NONE Performed at [facility], [address], [place], [place] [address]    Culture   Final    NO GROWTH Performed at [facility], [place] [address] [place], [place] [address]    Report Status 01/03/2021 FINAL  Final  CULTURE, BLOOD (ROUTINE X 2) w Reflex to ID Panel     Status: None (Preliminary result)   Collection Time: 01/01/21 11:23  PM   Specimen: BLOOD  Result Value Ref Range Status   Specimen Description BLOOD BLOOD LEFT HAND  Final   Special Requests   Final    BOTTLES DRAWN AEROBIC AND ANAEROBIC Blood Culture adequate volume   Culture   Final    NO GROWTH 3 DAYS Performed at [facility], [address], [place], [place] [address]    Report Status PENDING  Incomplete  CULTURE, BLOOD (ROUTINE X 2) w Reflex to ID Panel     Status: None (Preliminary result)   Collection Time: 01/01/21 11:24 PM   Specimen: BLOOD  Result Value Ref Range Status   Specimen Description BLOOD BLOOD RIGHT HAND  Final   Special Requests   Final    BOTTLES DRAWN AEROBIC AND ANAEROBIC Blood Culture adequate volume   Culture   Final    NO  GROWTH 3 DAYS Performed at [facility], [address], [place], [place] [address]    Report Status PENDING  Incomplete    RADIOLOGY:  US PELVIS LIMITED (TRANSABDOMINAL ONLY)  Result Date: 01/04/2021 CLINICAL DATA:  Urinary retention. Foley catheter not emptying properly. EXAM: TRANSABDOMINAL ULTRASOUND OF PELVIS TECHNIQUE: Transabdominal ultrasound examination of the pelvis was performed. COMPARISON:  Ultrasound abdomen 01/01/2021. FINDINGS: Foley catheter was initially noted be present in what appear to be a collapsed bladder. The catheter was clamped by nursing and bladder distended to confirm the presence of the Foley catheter within the bladder. Ascites present. IMPRESSION: Foley catheter noted in good anatomic position in the bladder. Ascites. Electronically Signed   By: Marcello Moores  Register   On: 01/04/2021 13:45   [place] ASCITES (ABDOMEN LIMITED)  Result Date: 01/03/2021 CLINICAL DATA:  70 year old male presents for possible paracentesis. There is a pending tunneled peritoneal drainage catheter on schedule EXAM: LIMITED ABDOMEN ULTRASOUND FOR ASCITES TECHNIQUE: Limited ultrasound survey for ascites was performed in all four abdominal quadrants. COMPARISON:  None. FINDINGS: Ultrasound images demonstrate moderate volume of ascites. After discussion with the primary team regarding the patient's upcoming tunneled peritoneal drainage catheter, we deferred a paracentesis at this time. IMPRESSION: Ultrasound demonstrates adequate ascites for upcoming safe placement of tunneled peritoneal drainage catheter Electronically Signed   By: Corrie Mckusick D.O.   On: 01/03/2021 14:54   IR PERC PLEURAL DRAIN W/INDWELL CATH W/IMG GUIDE  Result Date: 01/05/2021 INDICATION: 70 year old male referred for tunneled peritoneal catheter placement for recurrent ascites EXAM: IMAGE GUIDED PLACEMENT OF TUNNELED CUFFED PERITONEAL DRAINAGE CATHETER MEDICATIONS: 2 g Ancef ANESTHESIA/SEDATION: Fentanyl 25 mcg IV; Versed 0  mg IV Moderate Sedation Time:  0 minutes The patient was continuously monitored during the procedure by the interventional radiology nurse under my direct supervision. COMPLICATIONS: None PROCEDURE: The procedure, risks, benefits, and alternatives were explained to the patient and the patient's family. Specific risks that were addressed included bleeding, infection, need for further procedure, chance of delayed hemorrhage, cardiopulmonary collapse, death. Questions regarding the procedure were encouraged and answered. The patient understands and consents to the procedure. The right abdominal wall was prepped with Betadine in a sterile fashion, and a sterile drape was applied covering the operative field. A sterile gown and sterile gloves were used for the procedure. Local anesthesia was provided with 1% Lidocaine. Ultrasound image documentation was performed. After creating a small skin incision, a 19 gauge needle was advanced into the peritoneal cavity under ultrasound guidance. A guide wire was then advanced under fluoroscopy into the space. Access was dilated serially and a 16-French peel-away sheath placed. The skin and  subcutaneous tissues were generously infiltrated with 1% lidocaine from the puncture site along the abdomen anteriorly. A small stab incision was made with 11 blade scalpel at the insertion site of the catheter, and the catheter was back tunneled to the site at the puncture. A tunneled cuffed peritoneal drainage catheter was placed. This was tunneled from the incision 5 cm anterior to the access to the access site. The catheter was advanced through the peel-away sheath. The sheath was then removed. Final catheter positioning was confirmed with a fluoroscopic spot image. The access incision was closed Dermabond. Dermabond was applied to the catheterization incision. Large volume paracentesis was performed through the new catheter utilizing vacuum containers. The patient tolerated the procedure  well and remained hemodynamically stable throughout. No complications were encountered and no significant blood loss was encountered. IMPRESSION: Status post image guided placement of tunneled cuffed peritoneal drainage catheter. Signed, Dulcy Fanny. Dellia Nims, RPVI [facility] Electronically Signed   By: Corrie Mckusick D.O.   On: 01/05/2021 11:18     CODE STATUS:     Code Status Orders  (From admission, onward)         Start     Ordered   01/03/21 1236  Do not attempt resuscitation (DNR)  Continuous       Question Answer Comment  In the event of cardiac or respiratory ARREST Do not call a "code blue"   In the event of cardiac or respiratory ARREST Do not perform Intubation, CPR, defibrillation or ACLS   In the event of cardiac or respiratory ARREST Use medication by any route, position, wound care, and other measures to relive pain and suffering. May use oxygen, suction and manual treatment of airway obstruction as needed for comfort.   Comments MOST form in chart.      01/03/21 1236        Code Status History    Date Active Date Inactive Code Status Order ID Comments User Context   01/01/2021 1754 01/03/2021 1236 Full Code [id number]  Kayleen Memos DO ED   Advance Care Planning Activity       TOTAL TIME TAKING CARE OF THIS PATIENT: **40* minutes.    Fritzi Mandes M.D  [facility]    CC: Primary care physician; Rogers Blocker, MD

## 2021-01-05 NOTE — Progress Notes (Signed)
Cameron Antigua, MD [address] [place], [place], [place], [address] [address], [place], [place], [place], [address] Phone: [phone]  Fax: [phone]   Subjective: Patient conversive and sitting in bed comfortably.  Denies any complaints, states he is tired   Objective: Exam: Vital signs in last 24 hours: Vitals:   01/05/21 1011 01/05/21 1027 01/05/21 1115 01/05/21 1322  BP: 126/69 125/73 122/63 123/67  Pulse: (!) 102 (!) 106 (!) 102 (!) 105  Resp: 15 15 14 14   Temp:    98.5 F (36.9 C)  TempSrc:    Oral  SpO2:  96% 96% 95%  Weight:      Height:       Weight change:   Intake/Output Summary (Last 24 hours) at 01/05/2021 1344 Last data filed at 01/05/2021 1115 Gross per 24 hour  Intake 0 ml  Output 8295 ml  Net -8295 ml    General: No acute distress, Abd: Soft, distended, No HSM Skin: Warm, no rashes Neck: Supple, Trachea midline   Lab Results: Lab Results  Component Value Date   WBC 6.6 01/02/2021   HGB 9.8 (L) 01/02/2021   HCT 27.4 (L) 01/02/2021   MCV 103.8 (H) 01/02/2021   PLT 78 (L) 01/02/2021   Micro Results: Recent Results (from the past 240 hour(s))  Resp Panel by RT-PCR (Flu A&B, Covid) Nasopharyngeal Swab     Status: None   Collection Time: 01/01/21  3:53 PM   Specimen: Nasopharyngeal Swab; Nasopharyngeal(NP) swabs in vial transport medium  Result Value Ref Range Status   SARS Coronavirus 2 by RT PCR NEGATIVE NEGATIVE Final    Comment: (NOTE) SARS-CoV-2 target nucleic acids are NOT DETECTED.  The SARS-CoV-2 RNA is generally detectable in upper respiratory specimens during the acute phase of infection. The lowest concentration of SARS-CoV-2 viral copies this assay can detect is 138 copies/mL. A negative result does not preclude SARS-Cov-2 infection and should not be used as the sole basis for treatment or other patient management decisions. A negative result may occur with  improper specimen collection/handling, submission of  specimen other than nasopharyngeal swab, presence of viral mutation(s) within the areas targeted by this assay, and inadequate number of viral copies(<138 copies/mL). A negative result must be combined with clinical observations, patient history, and epidemiological information. The expected result is Negative.  Fact Sheet for Patients:  EntrepreneurPulse.com.au  Fact Sheet for Healthcare Providers:  IncredibleEmployment.be  This test is no t yet approved or cleared by the [place] FDA and  has been authorized for detection and/or diagnosis of SARS-CoV-2 by FDA under an Emergency Use Authorization (EUA). This EUA will remain  in effect (meaning this test can be used) for the duration of the COVID-19 declaration under Section 564(b)(1) of the Act, 21 U.S.C.section 360bbb-3(b)(1), unless the authorization is terminated  or revoked sooner.       Influenza A by PCR NEGATIVE NEGATIVE Final   Influenza B by PCR NEGATIVE NEGATIVE Final    Comment: (NOTE) The Xpert Xpress SARS-CoV-2/FLU/RSV plus assay is intended as an aid in the diagnosis of influenza from Nasopharyngeal swab specimens and should not be used as a sole basis for treatment. Nasal washings and aspirates are unacceptable for Xpert Xpress SARS-CoV-2/FLU/RSV testing.  Fact Sheet for Patients: EntrepreneurPulse.com.au  Fact Sheet for Healthcare Providers: IncredibleEmployment.be  This test is not yet approved or cleared by the [place] FDA and has been authorized for detection and/or diagnosis of SARS-CoV-2 by FDA under an Emergency Use Authorization (  EUA). This EUA will remain in effect (meaning this test can be used) for the duration of the COVID-19 declaration under Section 564(b)(1) of the Act, 21 U.S.C. section 360bbb-3(b)(1), unless the authorization is terminated or revoked.  Performed at [facility], [address] [place], [place] [address]   Urine Culture     Status: None   Collection Time: 01/01/21  6:03 PM   Specimen: Urine, Random  Result Value Ref Range Status   Specimen Description   Final    URINE, RANDOM Performed at [facility], [address], [place], [place] [address]    Special Requests   Final    NONE Performed at [facility], [address], [place], [place] [address]    Culture   Final    NO GROWTH Performed at [facility], [place] [address], [place], [place] [address]    Report Status 01/03/2021 FINAL  Final  CULTURE, BLOOD (ROUTINE X 2) w Reflex to ID Panel     Status: None (Preliminary result)   Collection Time: 01/01/21 11:23 PM   Specimen: BLOOD  Result Value Ref Range Status   Specimen Description BLOOD BLOOD LEFT HAND  Final   Special Requests   Final    BOTTLES DRAWN AEROBIC AND ANAEROBIC Blood Culture adequate volume   Culture   Final    NO GROWTH 3 DAYS Performed at [facility], [address] [place], [place] [address]    Report Status PENDING  Incomplete  CULTURE, BLOOD (ROUTINE X 2) w Reflex to ID Panel     Status: None (Preliminary result)   Collection Time: 01/01/21 11:24 PM   Specimen: BLOOD  Result Value Ref Range Status   Specimen Description BLOOD BLOOD RIGHT HAND  Final   Special Requests   Final    BOTTLES DRAWN AEROBIC AND ANAEROBIC Blood Culture adequate volume   Culture   Final    NO GROWTH 3 DAYS Performed at [facility], [address] [place], [place] [address]    Report Status PENDING  Incomplete   Studies/Results: US PELVIS LIMITED (TRANSABDOMINAL ONLY)  Result Date: 01/04/2021 CLINICAL DATA:  Urinary retention. Foley catheter not emptying properly. EXAM: TRANSABDOMINAL ULTRASOUND OF PELVIS TECHNIQUE: Transabdominal ultrasound examination of the pelvis was performed. COMPARISON:  Ultrasound abdomen 01/01/2021. FINDINGS: Foley catheter was initially noted be present in what appear to be a  collapsed bladder. The catheter was clamped by nursing and bladder distended to confirm the presence of the Foley catheter within the bladder. Ascites present. IMPRESSION: Foley catheter noted in good anatomic position in the bladder. Ascites. Electronically Signed   By: Marcello Moores  Register   On: 01/04/2021 13:45   IR PERC PLEURAL DRAIN W/INDWELL CATH W/IMG GUIDE  Result Date: 01/05/2021 INDICATION: 70 year old male referred for tunneled peritoneal catheter placement for recurrent ascites EXAM: IMAGE GUIDED PLACEMENT OF TUNNELED CUFFED PERITONEAL DRAINAGE CATHETER MEDICATIONS: 2 g Ancef ANESTHESIA/SEDATION: Fentanyl 25 mcg IV; Versed 0 mg IV Moderate Sedation Time:  0 minutes The patient was continuously monitored during the procedure by the interventional radiology nurse under my direct supervision. COMPLICATIONS: None PROCEDURE: The procedure, risks, benefits, and alternatives were explained to the patient and the patient's family. Specific risks that were addressed included bleeding, infection, need for further procedure, chance of delayed hemorrhage, cardiopulmonary collapse, death. Questions regarding the procedure were encouraged and answered. The patient understands and consents to the procedure. The right abdominal wall was prepped with Betadine in a sterile fashion,  and a sterile drape was applied covering the operative field. A sterile gown and sterile gloves were used for the procedure. Local anesthesia was provided with 1% Lidocaine. Ultrasound image documentation was performed. After creating a small skin incision, a 19 gauge needle was advanced into the peritoneal cavity under ultrasound guidance. A guide wire was then advanced under fluoroscopy into the space. Access was dilated serially and a 16-French peel-away sheath placed. The skin and subcutaneous tissues were generously infiltrated with 1% lidocaine from the puncture site along the abdomen anteriorly. A small stab incision was made with 11  blade scalpel at the insertion site of the catheter, and the catheter was back tunneled to the site at the puncture. A tunneled cuffed peritoneal drainage catheter was placed. This was tunneled from the incision 5 cm anterior to the access to the access site. The catheter was advanced through the peel-away sheath. The sheath was then removed. Final catheter positioning was confirmed with a fluoroscopic spot image. The access incision was closed Dermabond. Dermabond was applied to the catheterization incision. Large volume paracentesis was performed through the new catheter utilizing vacuum containers. The patient tolerated the procedure well and remained hemodynamically stable throughout. No complications were encountered and no significant blood loss was encountered. IMPRESSION: Status post image guided placement of tunneled cuffed peritoneal drainage catheter. Signed, Dulcy Fanny. Dellia Nims, RPVI [facility] Electronically Signed   By: Corrie Mckusick D.O.   On: 01/05/2021 11:18   Medications:  Scheduled Meds: . Chlorhexidine Gluconate Cloth  6 each Topical Daily  . ciprofloxacin  500 mg Oral Daily  . feeding supplement  237 mL Oral TID BM  . fentaNYL       Continuous Infusions: . sodium chloride 10 mL/hr at 01/05/21 0935  .  ceFAZolin (ANCEF) IV     PRN Meds:.LORazepam, morphine CONCENTRATE, ondansetron (ZOFRAN) IV   Assessment: Active Problems:   Hepatic encephalopathy (HCC)   Protein-calorie malnutrition, severe   AKI (acute kidney injury) ([place])   Jaundice   Ascites due to alcoholic cirrhosis [facility])   Hospice care patient    Plan: Patient is going home with hospice Peritoneal catheter has been placed by IR Palliative care on board Follow-up with [facility] at the time of discharge  GI service will sign off at this time, please page with any questions or concerns   LOS: 4 days   Cameron Antigua, MD 01/05/2021, 1:44 PM

## 2021-01-05 NOTE — TOC Transition Note (Addendum)
Transition of Care [facility]) - CM/SW Discharge Note   Patient Details  Name: Cameron Miles MRN: [id number] Date of Birth: 01-02-1951  [facility] Care [facility]) CM/SW Contact:  Alberteen Sam, LCSW Phone Number: 01/05/2021, 1:45 PM   Clinical Narrative:     Patient will DC to: Home with hospice Anticipated DC date: 01/05/2021 Family notified: Cindy - lvm Transport by: Johnanna Schneiders  Per MD patient ready for DC to home with hospice. RN, patient, patient's family, and Kieth Brightly with [facility] informed of discharge. Discharge Summary coimpleted. RN given number for report. DC packet on chart. Ambulance transport requested for patient by RN Freda Munro. [facility] forms printed to floor, Freda Munro to receive and gather with DNR for EMS arrival.  CSW spoke with Kieth Brightly with [facility] who reports that Medicare will not pay for both home health services and hospice. Hospice Liaison Kieth Brightly with [facility] reports hospice home care team can drain catheter as needed, as he will have an in home nurse coordinating all care.    CSW signing off.   [place], [place]   Final next level of care: Home w Hospice Care Barriers to Discharge: No Barriers Identified   Patient Goals and CMS Choice Patient states their goals for this hospitalization and ongoing recovery are:: to go home CMS Medicare.gov Compare Post Acute Care list provided to:: Patient Choice offered to / list presented to : Patient  Discharge Placement                Patient to be transferred to facility by: [facility]   Patient and family notified of of transfer: 01/05/21  Discharge Plan and Services     Post Acute Care Choice: Durable Medical Equipment,Hospice          DME Arranged: Overbed table,Hospital bed,Bedside commode DME Agency: Other - Comment (Hospice will order)                  Social Determinants of Health (SDOH) Interventions     Readmission Risk Interventions No flowsheet data found.

## 2021-01-07 LAB — CULTURE, BLOOD (ROUTINE X 2)
Culture: NO GROWTH
Culture: NO GROWTH
Special Requests: ADEQUATE
Special Requests: ADEQUATE

## 2021-01-15 DIAGNOSIS — R339 Retention of urine, unspecified: Secondary | ICD-10-CM

## 2021-01-17 ENCOUNTER — Telehealth: Payer: Self-pay | Admitting: Family Medicine

## 2021-01-17 NOTE — Telephone Encounter (Signed)
I spoke to patient's wife, Caren Griffins,  about scheduling her AWV.  Caren Griffins wanted me to let Dr.Sonnenberg know that patient passed away on 31-Jan-2021.

## 2021-01-17 NOTE — Telephone Encounter (Signed)
Please mail.

## 2021-01-17 NOTE — Telephone Encounter (Signed)
put the sympathy card on your desk for your signature.  Joannie Medine,cma

## 2021-01-17 NOTE — Telephone Encounter (Signed)
I put the sympathy card on your desk for your signature.  Ijeoma Loor,cma

## 2021-01-17 NOTE — Telephone Encounter (Signed)
I am sorry to hear that. Do we have any condolences cards to send the family?

## 2021-01-31 DEATH — deceased
